# Patient Record
Sex: Male | Born: 1960
Health system: Southern US, Community
[De-identification: ages and names within clinical notes are randomized; demographics above are authoritative.]

## PROBLEM LIST (undated history)

## (undated) DIAGNOSIS — Z8619 Personal history of other infectious and parasitic diseases: Secondary | ICD-10-CM

## (undated) DIAGNOSIS — C4492 Squamous cell carcinoma of skin, unspecified: Secondary | ICD-10-CM

## (undated) DIAGNOSIS — M109 Gout, unspecified: Secondary | ICD-10-CM

## (undated) DIAGNOSIS — C4491 Basal cell carcinoma of skin, unspecified: Secondary | ICD-10-CM

## (undated) DIAGNOSIS — I10 Essential (primary) hypertension: Secondary | ICD-10-CM

## (undated) DIAGNOSIS — C439 Malignant melanoma of skin, unspecified: Secondary | ICD-10-CM

## (undated) HISTORY — DX: Gout, unspecified: M10.9

## (undated) HISTORY — DX: Essential (primary) hypertension: I10

## (undated) HISTORY — DX: Personal history of other infectious and parasitic diseases: Z86.19

---
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
## 1996-12-14 HISTORY — PX: CHOLECYSTECTOMY: SHX55

## 2010-12-14 HISTORY — PX: COLONOSCOPY: SHX174

## 2011-03-27 LAB — HM COLONOSCOPY: HM COLON: NORMAL

## 2013-08-07 DIAGNOSIS — C4492 Squamous cell carcinoma of skin, unspecified: Secondary | ICD-10-CM

## 2013-08-07 HISTORY — DX: Squamous cell carcinoma of skin, unspecified: C44.92

## 2014-03-23 ENCOUNTER — Telehealth: Payer: Self-pay

## 2014-03-23 NOTE — Telephone Encounter (Signed)
Unable to reach patient.  Invalid number.

## 2014-03-26 ENCOUNTER — Encounter: Payer: Self-pay | Admitting: Family Medicine

## 2014-03-26 ENCOUNTER — Ambulatory Visit (INDEPENDENT_AMBULATORY_CARE_PROVIDER_SITE_OTHER): Payer: BC Managed Care – PPO | Admitting: Family Medicine

## 2014-03-26 ENCOUNTER — Other Ambulatory Visit: Payer: Self-pay | Admitting: Family Medicine

## 2014-03-26 VITALS — BP 140/100 | HR 91 | Temp 98.8°F | Resp 16 | Ht 69.75 in | Wt 257.5 lb

## 2014-03-26 DIAGNOSIS — I10 Essential (primary) hypertension: Secondary | ICD-10-CM

## 2014-03-26 DIAGNOSIS — M109 Gout, unspecified: Secondary | ICD-10-CM

## 2014-03-26 MED ORDER — TELMISARTAN 80 MG PO TABS: 80.0000 mg | ORAL_TABLET | Freq: Every day | ORAL | Status: DC

## 2014-03-26 MED ORDER — ALLOPURINOL 300 MG PO TABS: 300.0000 mg | ORAL_TABLET | Freq: Every day | ORAL | Status: DC

## 2014-03-26 MED ORDER — CARVEDILOL 25 MG PO TABS: 25.0000 mg | ORAL_TABLET | Freq: Two times a day (BID) | ORAL | Status: DC

## 2014-03-26 NOTE — Telephone Encounter (Signed)
Unable to reach patient pre visit.  

## 2014-03-26 NOTE — Progress Notes (Signed)
Pre visit review using our clinic review tool, if applicable. No additional management support is needed unless otherwise documented below in the visit note. 

## 2014-03-26 NOTE — Assessment & Plan Note (Signed)
New to provider, ongoing for pt.  Not well controlled.  Current meds are odd and not maximized.  Pt on 5 meds but not taking max dose ARB or Coreg BID.  Will try to maximize meds, stop diuretic if possible as this is likely contributing to pt's gout.  Wean Hydralazine due to inconvenient dosing.  Will try and maximize and simplify pt's med regimen.  Pt is all for this.  Will follow closely.

## 2014-03-26 NOTE — Progress Notes (Signed)
   Subjective:    Patient ID: Keith Haynes, male    DOB: May 21, 1961, 53 y.o.   MRN: [id number]  HPI New to establish.  Previous MD- none recently [facility] about 5-6 yrs ago) Cards- Dr Bettina Gavia Tia Alert)  HTN- chronic problem, has had hx of difficult to control BP.  Had renal US, cath, stress test w/o any obvious cause.  No CP, SOB, HAs, visual changes, edema.  On Carvedilol, Eplerenone, Hydralazine, Lasix (1/2 tab), Micardis (1/2 tab).  Gout- chronic problem, on Allopurinol and Colcrys (it's useless since switching from Colchicine)  Continues to have flares.  Review of Systems For ROS see HPI     Objective:   Physical Exam  Vitals reviewed. Constitutional: He is oriented to person, place, and time. He appears well-developed and well-nourished. No distress.  HENT:  Head: Normocephalic and atraumatic.  Eyes: Conjunctivae and EOM are normal. Pupils are equal, round, and reactive to light.  Neck: Normal range of motion. Neck supple. No thyromegaly present.  Cardiovascular: Normal rate, regular rhythm, normal heart sounds and intact distal pulses.   No murmur heard. Pulmonary/Chest: Effort normal and breath sounds normal. No respiratory distress.  Abdominal: Soft. Bowel sounds are normal. He exhibits no distension.  Musculoskeletal: He exhibits no edema.  Lymphadenopathy:    He has no cervical adenopathy.  Neurological: He is alert and oriented to person, place, and time. No cranial nerve deficit.  Skin: Skin is warm and dry.  Psychiatric: He has a normal mood and affect. His behavior is normal.          Assessment & Plan:

## 2014-03-26 NOTE — Patient Instructions (Signed)
Follow up in 1 month to recheck BP Please check your BP at home daily if you are able and record the readings.  If they go up as we change the meds, please call me! Start taking Carvedilol twice daily Increase the Micardis to 1 tab daily (not 1/2 tab) Decrease the Hydralazine to twice daily x1 week, then once daily x1 week and then stop STOP the eplerenone (your 2nd fluid pill) Continue the Furosemide 1/2 tab daily (fluid pill) Start the new allopurinol dose- 300mg  daily We'll notify you of your lab results and make any changes if needed Call with any questions or concerns Welcome!  We're glad to have you!!

## 2014-03-26 NOTE — Assessment & Plan Note (Signed)
New to provider, ongoing for pt.  Increase allopurinol to 300mg  daily to try and get better control of UA and prevent flares.  Continue Colchicine while titrating allopurinol to prevent flare.  Will wean as able.  Pt expressed understanding and is in agreement w/ plan.

## 2014-03-27 ENCOUNTER — Telehealth: Payer: Self-pay | Admitting: Family Medicine

## 2014-03-27 LAB — BASIC METABOLIC PANEL
BUN: 22 mg/dL (ref 6–23)
CHLORIDE: 106 meq/L (ref 96–112)
CO2: 26 meq/L (ref 19–32)
Calcium: 9.1 mg/dL (ref 8.4–10.5)
Creatinine, Ser: 1.3 mg/dL (ref 0.4–1.5)
GFR: 62.37 mL/min (ref >60.00)
Glucose, Bld: 121 mg/dL — ABNORMAL HIGH (ref 70–99)
POTASSIUM: 3.7 meq/L (ref 3.5–5.1)
Sodium: 140 mEq/L (ref 135–145)

## 2014-03-27 LAB — HEPATIC FUNCTION PANEL
ALK PHOS: 143 U/L — AB (ref 39–117)
ALT: 19 U/L (ref 0–53)
AST: 25 U/L (ref 0–37)
Albumin: 3.7 g/dL (ref 3.5–5.2)
BILIRUBIN DIRECT: 0 mg/dL (ref 0.0–0.3)
BILIRUBIN TOTAL: 0.4 mg/dL (ref 0.3–1.2)
Total Protein: 6.7 g/dL (ref 6.0–8.3)

## 2014-03-27 LAB — TSH: TSH: 1.33 u[IU]/mL (ref 0.35–5.50)

## 2014-03-27 LAB — LIPID PANEL
CHOLESTEROL: 180 mg/dL (ref 0–200)
HDL: 44.9 mg/dL (ref >39.00)
LDL CALC: 103 mg/dL — AB (ref 0–99)
TRIGLYCERIDES: 161 mg/dL — AB (ref 0.0–149.0)
Total CHOL/HDL Ratio: 4
VLDL: 32.2 mg/dL (ref 0.0–40.0)

## 2014-03-27 LAB — URIC ACID: URIC ACID, SERUM: 7.3 mg/dL (ref 4.0–7.8)

## 2014-03-27 NOTE — Telephone Encounter (Signed)
Relevant patient education assigned to patient using Emmi. ° °

## 2014-03-28 ENCOUNTER — Ambulatory Visit: Payer: BC Managed Care – PPO

## 2014-03-28 DIAGNOSIS — R748 Abnormal levels of other serum enzymes: Secondary | ICD-10-CM

## 2014-03-28 LAB — GAMMA GT: GGT: 32 U/L (ref 7–51)

## 2014-04-05 ENCOUNTER — Encounter: Payer: Self-pay | Admitting: Family Medicine

## 2014-04-05 MED ORDER — TELMISARTAN-HCTZ 80-12.5 MG PO TABS: 1.0000 | ORAL_TABLET | Freq: Every day | ORAL | Status: DC

## 2014-04-05 MED ORDER — AMLODIPINE BESYLATE 5 MG PO TABS: 5.0000 mg | ORAL_TABLET | Freq: Every day | ORAL | Status: DC

## 2014-04-05 NOTE — Telephone Encounter (Signed)
Amlodipine added per Tabori.

## 2014-04-05 NOTE — Addendum Note (Signed)
Addended by: Kris Hartmann on: 04/05/2014 01:57 PM   Modules accepted: Orders

## 2014-04-05 NOTE — Telephone Encounter (Signed)
Pt changed to micardis/HCT 80-12.5 per [employer].

## 2014-04-17 DIAGNOSIS — C439 Malignant melanoma of skin, unspecified: Secondary | ICD-10-CM | POA: Insufficient documentation

## 2014-04-17 HISTORY — DX: Malignant melanoma of skin, unspecified: C43.9

## 2014-04-25 ENCOUNTER — Ambulatory Visit: Payer: BC Managed Care – PPO | Admitting: Family Medicine

## 2014-04-26 ENCOUNTER — Ambulatory Visit (INDEPENDENT_AMBULATORY_CARE_PROVIDER_SITE_OTHER): Payer: BC Managed Care – PPO | Admitting: Family Medicine

## 2014-04-26 ENCOUNTER — Encounter: Payer: Self-pay | Admitting: Family Medicine

## 2014-04-26 VITALS — BP 138/100 | HR 77 | Temp 98.2°F | Resp 14 | Wt 256.8 lb

## 2014-04-26 DIAGNOSIS — I10 Essential (primary) hypertension: Secondary | ICD-10-CM

## 2014-04-26 DIAGNOSIS — N529 Male erectile dysfunction, unspecified: Secondary | ICD-10-CM | POA: Insufficient documentation

## 2014-04-26 LAB — BASIC METABOLIC PANEL
BUN: 23 mg/dL (ref 6–23)
CO2: 29 meq/L (ref 19–32)
Calcium: 9.3 mg/dL (ref 8.4–10.5)
Chloride: 102 mEq/L (ref 96–112)
Creatinine, Ser: 1.4 mg/dL (ref 0.4–1.5)
GFR: 57.17 mL/min — ABNORMAL LOW (ref >60.00)
GLUCOSE: 90 mg/dL (ref 70–99)
POTASSIUM: 3.5 meq/L (ref 3.5–5.1)
Sodium: 138 mEq/L (ref 135–145)

## 2014-04-26 MED ORDER — CARVEDILOL 25 MG PO TABS: 25.0000 mg | ORAL_TABLET | Freq: Two times a day (BID) | ORAL | Status: DC

## 2014-04-26 MED ORDER — TADALAFIL 20 MG PO TABS: 20.0000 mg | ORAL_TABLET | Freq: Every day | ORAL | Status: DC | PRN

## 2014-04-26 MED ORDER — AMLODIPINE BESYLATE 5 MG PO TABS: 5.0000 mg | ORAL_TABLET | Freq: Every day | ORAL | Status: DC

## 2014-04-26 MED ORDER — ALLOPURINOL 300 MG PO TABS: 300.0000 mg | ORAL_TABLET | Freq: Every day | ORAL | Status: DC

## 2014-04-26 MED ORDER — FUROSEMIDE 20 MG PO TABS: 20.0000 mg | ORAL_TABLET | Freq: Every day | ORAL | Status: DC

## 2014-04-26 MED ORDER — TELMISARTAN-HCTZ 80-12.5 MG PO TABS: 1.0000 | ORAL_TABLET | Freq: Every day | ORAL | Status: DC

## 2014-04-26 NOTE — Patient Instructions (Signed)
Schedule your complete physical in 6 months We'll notify you of your lab results and make any changes if needed (but I don't expect this!) Start the Cialis as needed- break the pills in half for the 10mg  dose Call with any questions or concerns So glad that you're feeling better!

## 2014-04-26 NOTE — Assessment & Plan Note (Signed)
BP is improved since last visit.  Pt is feeling better.  Check BMP.  No anticipated changes.  meds filled.

## 2014-04-26 NOTE — Assessment & Plan Note (Signed)
New.  Pt given script and coupon for Cialis.  Reviewed appropriate use and possible side effects.  Will follow.

## 2014-04-26 NOTE — Progress Notes (Signed)
   Subjective:    Patient ID: Keith Haynes, male    DOB: 08/03/1961, 53 y.o.   MRN: [id number]  HPI HTN- chronic problem.  On Micardis HCTZ, Amlodipine, Coreg.  Has weaned off Hydralazine.  Home BP ranging 106-154/65-95.  No CP, SOB, HAs, visual changes, edema.  Pt actually reports less HAs.    ED- pt reports now that BP is better controlled, the ED is worse.  Able to have erections but not as strong as previous.  Review of Systems For ROS see HPI     Objective:   Physical Exam  Vitals reviewed. Constitutional: He is oriented to person, place, and time. He appears well-developed and well-nourished. No distress.  HENT:  Head: Normocephalic and atraumatic.  Eyes: Conjunctivae and EOM are normal. Pupils are equal, round, and reactive to light.  Neck: Normal range of motion. Neck supple. No thyromegaly present.  Cardiovascular: Normal rate, regular rhythm, normal heart sounds and intact distal pulses.   No murmur heard. Pulmonary/Chest: Effort normal and breath sounds normal. No respiratory distress.  Abdominal: Soft. Bowel sounds are normal. He exhibits no distension.  Musculoskeletal: He exhibits no edema.  Lymphadenopathy:    He has no cervical adenopathy.  Neurological: He is alert and oriented to person, place, and time. No cranial nerve deficit.  Skin: Skin is warm and dry.  Psychiatric: He has a normal mood and affect. His behavior is normal.          Assessment & Plan:

## 2014-04-27 ENCOUNTER — Encounter: Payer: Self-pay | Admitting: Family Medicine

## 2014-04-27 ENCOUNTER — Telehealth: Payer: Self-pay | Admitting: Family Medicine

## 2014-04-27 NOTE — Telephone Encounter (Signed)
Relevant patient education assigned to patient using Emmi. ° °

## 2014-05-17 ENCOUNTER — Other Ambulatory Visit: Payer: Self-pay | Admitting: Family Medicine

## 2014-05-18 NOTE — Telephone Encounter (Signed)
Med filled.  

## 2014-05-22 ENCOUNTER — Telehealth: Payer: Self-pay

## 2014-05-22 NOTE — Telephone Encounter (Signed)
Necessary prior authorization paperwork faxed to [facility]. Awaiting determination. JG//CMA

## 2014-05-22 NOTE — Telephone Encounter (Signed)
Received approval letter from [facility] effective from 05/22/2014 through 12/13/2038. Reference number: [id number]. Called and informed patient. JG//CMA

## 2014-05-22 NOTE — Telephone Encounter (Signed)
Spoke with patient regarding PA for Cialis. Advised that the PA has been sent to the insurance company. Apologized for the miscommunication with the computers. Advised to let us know if he has not heard by the end of the week.

## 2014-09-13 DIAGNOSIS — C4492 Squamous cell carcinoma of skin, unspecified: Secondary | ICD-10-CM | POA: Insufficient documentation

## 2014-10-03 DIAGNOSIS — C4491 Basal cell carcinoma of skin, unspecified: Secondary | ICD-10-CM

## 2014-10-03 HISTORY — DX: Basal cell carcinoma of skin, unspecified: C44.91

## 2014-10-29 ENCOUNTER — Encounter: Payer: Self-pay | Admitting: Family Medicine

## 2014-10-29 ENCOUNTER — Ambulatory Visit (INDEPENDENT_AMBULATORY_CARE_PROVIDER_SITE_OTHER): Payer: BC Managed Care – PPO | Admitting: Family Medicine

## 2014-10-29 VITALS — BP 130/84 | HR 72 | Temp 98.1°F | Resp 16 | Ht 69.75 in | Wt 258.1 lb

## 2014-10-29 DIAGNOSIS — M1009 Idiopathic gout, multiple sites: Secondary | ICD-10-CM

## 2014-10-29 DIAGNOSIS — Z0000 Encounter for general adult medical examination without abnormal findings: Secondary | ICD-10-CM

## 2014-10-29 LAB — BASIC METABOLIC PANEL
BUN: 25 mg/dL — AB (ref 6–23)
CALCIUM: 9.6 mg/dL (ref 8.4–10.5)
CO2: 29 meq/L (ref 19–32)
CREATININE: 1.2 mg/dL (ref 0.4–1.5)
Chloride: 104 mEq/L (ref 96–112)
GFR: 67.7 mL/min (ref >60.00)
GLUCOSE: 102 mg/dL — AB (ref 70–99)
Potassium: 4.2 mEq/L (ref 3.5–5.1)
Sodium: 141 mEq/L (ref 135–145)

## 2014-10-29 LAB — CBC WITH DIFFERENTIAL/PLATELET
Basophils Absolute: 0 10*3/uL (ref 0.0–0.1)
Basophils Relative: 0.5 % (ref 0.0–3.0)
Eosinophils Absolute: 0.5 10*3/uL (ref 0.0–0.7)
Eosinophils Relative: 6.2 % — ABNORMAL HIGH (ref 0.0–5.0)
HCT: 50 % (ref 39.0–52.0)
Hemoglobin: 16.3 g/dL (ref 13.0–17.0)
Lymphocytes Relative: 22.6 % (ref 12.0–46.0)
Lymphs Abs: 1.8 10*3/uL (ref 0.7–4.0)
MCHC: 32.6 g/dL (ref 30.0–36.0)
MCV: 101.8 fl — ABNORMAL HIGH (ref 78.0–100.0)
Monocytes Absolute: 1 10*3/uL (ref 0.1–1.0)
Monocytes Relative: 12.1 % — ABNORMAL HIGH (ref 3.0–12.0)
Neutro Abs: 4.7 10*3/uL (ref 1.4–7.7)
Neutrophils Relative %: 58.6 % (ref 43.0–77.0)
Platelets: 195 10*3/uL (ref 150.0–400.0)
RBC: 4.91 Mil/uL (ref 4.22–5.81)
RDW: 13.5 % (ref 11.5–15.5)
WBC: 8.1 10*3/uL (ref 4.0–10.5)

## 2014-10-29 LAB — TSH: TSH: 2.15 u[IU]/mL (ref 0.35–4.50)

## 2014-10-29 LAB — LIPID PANEL
Cholesterol: 197 mg/dL (ref 0–200)
HDL: 40.3 mg/dL (ref >39.00)
LDL Cholesterol: 132 mg/dL — ABNORMAL HIGH (ref 0–99)
NONHDL: 156.7
TRIGLYCERIDES: 125 mg/dL (ref 0.0–149.0)
Total CHOL/HDL Ratio: 5
VLDL: 25 mg/dL (ref 0.0–40.0)

## 2014-10-29 LAB — HEPATIC FUNCTION PANEL
ALT: 29 U/L (ref 0–53)
AST: 30 U/L (ref 0–37)
Albumin: 4 g/dL (ref 3.5–5.2)
Alkaline Phosphatase: 127 U/L — ABNORMAL HIGH (ref 39–117)
Bilirubin, Direct: 0.1 mg/dL (ref 0.0–0.3)
Total Bilirubin: 0.6 mg/dL (ref 0.2–1.2)
Total Protein: 6.7 g/dL (ref 6.0–8.3)

## 2014-10-29 LAB — PSA: PSA: 1.4 ng/mL (ref 0.10–4.00)

## 2014-10-29 LAB — URIC ACID: Uric Acid, Serum: 9.6 mg/dL — ABNORMAL HIGH (ref 4.0–7.8)

## 2014-10-29 MED ORDER — AMLODIPINE BESYLATE 5 MG PO TABS: 5.0000 mg | ORAL_TABLET | Freq: Every day | ORAL | Status: DC

## 2014-10-29 MED ORDER — FUROSEMIDE 20 MG PO TABS: 20.0000 mg | ORAL_TABLET | Freq: Every day | ORAL | Status: DC

## 2014-10-29 MED ORDER — TADALAFIL 20 MG PO TABS: 20.0000 mg | ORAL_TABLET | Freq: Every day | ORAL | Status: DC | PRN

## 2014-10-29 MED ORDER — TELMISARTAN-HCTZ 80-12.5 MG PO TABS: 1.0000 | ORAL_TABLET | Freq: Every day | ORAL | Status: DC

## 2014-10-29 MED ORDER — COLCHICINE 0.6 MG PO TABS: ORAL_TABLET | ORAL | Status: DC

## 2014-10-29 MED ORDER — CARVEDILOL 25 MG PO TABS: 25.0000 mg | ORAL_TABLET | Freq: Two times a day (BID) | ORAL | Status: DC

## 2014-10-29 MED ORDER — ALLOPURINOL 300 MG PO TABS: 300.0000 mg | ORAL_TABLET | Freq: Every day | ORAL | Status: DC

## 2014-10-29 NOTE — Patient Instructions (Signed)
Follow up in 6 months to recheck BP We'll notify you of your lab results and make any changes Try and make healthy food choices and get regular exercise Call with any questions or concerns Happy Holidays!!!

## 2014-10-29 NOTE — Progress Notes (Signed)
Pre visit review using our clinic review tool, if applicable. No additional management support is needed unless otherwise documented below in the visit note. 

## 2014-10-29 NOTE — Progress Notes (Signed)
   Subjective:    Patient ID: Keith Haynes, male    DOB: 23-May-1961, 53 y.o.   MRN: [id number]  HPI CPE- UTD on colonoscopy.     Review of Systems Patient reports no vision/hearing changes, anorexia, fever ,adenopathy, persistant/recurrent hoarseness, swallowing issues, chest pain, palpitations, edema, persistant/recurrent cough, hemoptysis, dyspnea (rest,exertional, paroxysmal nocturnal), gastrointestinal  bleeding (melena, rectal bleeding), abdominal pain, excessive heart burn, GU symptoms (dysuria, hematuria, voiding/incontinence issues) syncope, focal weakness, memory loss, numbness & tingling, skin/hair/nail changes, depression, anxiety, abnormal bruising/bleeding, musculoskeletal symptoms/signs.     Objective:   Physical Exam BP 130/84 mmHg  Pulse 72  Temp(Src) 98.1 F (36.7 C) (Oral)  Resp 16  Ht 5' 9.75" (1.772 m)  Wt 258 lb 2 oz (117.085 kg)  BMI 37.29 kg/m2  SpO2 95%  General Appearance:    Alert, cooperative, no distress, appears stated age  Head:    Normocephalic, without obvious abnormality, atraumatic  Eyes:    PERRL, conjunctiva/corneas clear, EOM's intact, fundi    benign, both eyes       Ears:    Normal TM's and external ear canals, both ears  Nose:   Nares normal, septum midline, mucosa normal, no drainage   or sinus tenderness  Throat:   Lips, mucosa, and tongue normal; teeth and gums normal  Neck:   Supple, symmetrical, trachea midline, no adenopathy;       thyroid:  No enlargement/tenderness/nodules  Back:     Symmetric, no curvature, ROM normal, no CVA tenderness  Lungs:     Clear to auscultation bilaterally, respirations unlabored  Chest wall:    No tenderness or deformity  Heart:    Regular rate and rhythm, S1 and S2 normal, no murmur, rub   or gallop  Abdomen:     Soft, non-tender, bowel sounds active all four quadrants,    no masses, no organomegaly  Genitalia:    Normal male without lesion, discharge or tenderness  Rectal:    Normal tone, normal  prostate, no masses or tenderness  Extremities:   Extremities normal, atraumatic, no cyanosis or edema  Pulses:   2+ and symmetric all extremities  Skin:   Skin color, texture, turgor normal, no rashes or lesions  Lymph nodes:   Cervical, supraclavicular, and axillary nodes normal  Neurologic:   CNII-XII intact. Normal strength, sensation and reflexes      throughout          Assessment & Plan:

## 2014-10-29 NOTE — Assessment & Plan Note (Signed)
Pt's PE WNL w/ exception of obesity.  UTD on colonoscopy.  Check labs.  Anticipatory guidance provided.  

## 2014-10-29 NOTE — Assessment & Plan Note (Signed)
Pt reports that increasing med dosage has improved sxs.  Check labs to asses uric acid.

## 2015-04-30 ENCOUNTER — Ambulatory Visit (INDEPENDENT_AMBULATORY_CARE_PROVIDER_SITE_OTHER): Payer: BLUE CROSS/BLUE SHIELD | Admitting: Family Medicine

## 2015-04-30 ENCOUNTER — Encounter: Payer: Self-pay | Admitting: Family Medicine

## 2015-04-30 VITALS — BP 130/78 | HR 77 | Temp 98.0°F | Resp 16 | Wt 257.5 lb

## 2015-04-30 DIAGNOSIS — I10 Essential (primary) hypertension: Secondary | ICD-10-CM

## 2015-04-30 DIAGNOSIS — N529 Male erectile dysfunction, unspecified: Secondary | ICD-10-CM | POA: Diagnosis not present

## 2015-04-30 LAB — CBC WITH DIFFERENTIAL/PLATELET
BASOS ABS: 0 10*3/uL (ref 0.0–0.1)
Basophils Relative: 0.5 % (ref 0.0–3.0)
Eosinophils Absolute: 0.5 10*3/uL (ref 0.0–0.7)
Eosinophils Relative: 7.7 % — ABNORMAL HIGH (ref 0.0–5.0)
HCT: 46.1 % (ref 39.0–52.0)
Hemoglobin: 15.9 g/dL (ref 13.0–17.0)
LYMPHS PCT: 23.8 % (ref 12.0–46.0)
Lymphs Abs: 1.7 10*3/uL (ref 0.7–4.0)
MCHC: 34.5 g/dL (ref 30.0–36.0)
MCV: 97.9 fl (ref 78.0–100.0)
MONOS PCT: 10.9 % (ref 3.0–12.0)
Monocytes Absolute: 0.8 10*3/uL (ref 0.1–1.0)
NEUTROS PCT: 57.1 % (ref 43.0–77.0)
Neutro Abs: 4.1 10*3/uL (ref 1.4–7.7)
Platelets: 168 10*3/uL (ref 150.0–400.0)
RBC: 4.71 Mil/uL (ref 4.22–5.81)
RDW: 13.2 % (ref 11.5–15.5)
WBC: 7.1 10*3/uL (ref 4.0–10.5)

## 2015-04-30 LAB — BASIC METABOLIC PANEL
BUN: 18 mg/dL (ref 6–23)
CALCIUM: 9.6 mg/dL (ref 8.4–10.5)
CO2: 30 mEq/L (ref 19–32)
Chloride: 105 mEq/L (ref 96–112)
Creatinine, Ser: 1.19 mg/dL (ref 0.40–1.50)
GFR: 67.57 mL/min (ref >60.00)
GLUCOSE: 111 mg/dL — AB (ref 70–99)
Potassium: 3.9 mEq/L (ref 3.5–5.1)
SODIUM: 139 meq/L (ref 135–145)

## 2015-04-30 MED ORDER — TELMISARTAN-HCTZ 80-12.5 MG PO TABS: 1.0000 | ORAL_TABLET | Freq: Every day | ORAL | Status: DC

## 2015-04-30 MED ORDER — FUROSEMIDE 20 MG PO TABS: 20.0000 mg | ORAL_TABLET | Freq: Every day | ORAL | Status: DC

## 2015-04-30 MED ORDER — SILDENAFIL CITRATE 100 MG PO TABS: 50.0000 mg | ORAL_TABLET | Freq: Every day | ORAL | Status: DC | PRN

## 2015-04-30 MED ORDER — AMLODIPINE BESYLATE 5 MG PO TABS: 5.0000 mg | ORAL_TABLET | Freq: Every day | ORAL | Status: DC

## 2015-04-30 MED ORDER — CARVEDILOL 25 MG PO TABS: 25.0000 mg | ORAL_TABLET | Freq: Two times a day (BID) | ORAL | Status: DC

## 2015-04-30 MED ORDER — COLCHICINE 0.6 MG PO TABS: ORAL_TABLET | ORAL | Status: DC

## 2015-04-30 NOTE — Progress Notes (Signed)
   Subjective:    Patient ID: Keith Haynes, male    DOB: 1961-06-18, 54 y.o.   MRN: [id number]  HPI HTN- chronic problem for pt.  On Micardis HCT, Lasix, Coreg, Amlodipine.  Adequate control.  No CP, SOB, HAs, visual changes, edema.  ED- pt reports medication works but for 2-3 days afterwards will have leg and hip soreness.  Pt denies soreness from sex, 'it's a weird ache that just kinda wears off'.  Taking 1/2 pill.  Pt wants to switch to Viagra and see if he still has sxs.   Review of Systems For ROS see HPI     Objective:   Physical Exam  Constitutional: He is oriented to person, place, and time. He appears well-developed and well-nourished. No distress.  HENT:  Head: Normocephalic and atraumatic.  Eyes: Conjunctivae and EOM are normal. Pupils are equal, round, and reactive to light.  Neck: Normal range of motion. Neck supple. No thyromegaly present.  Cardiovascular: Normal rate, regular rhythm, normal heart sounds and intact distal pulses.   No murmur heard. Pulmonary/Chest: Effort normal and breath sounds normal. No respiratory distress.  Abdominal: Soft. Bowel sounds are normal. He exhibits no distension.  Musculoskeletal: He exhibits no edema.  Lymphadenopathy:    He has no cervical adenopathy.  Neurological: He is alert and oriented to person, place, and time. No cranial nerve deficit.  Skin: Skin is warm and dry.  Psychiatric: He has a normal mood and affect. His behavior is normal.  Vitals reviewed.         Assessment & Plan:

## 2015-04-30 NOTE — Patient Instructions (Signed)
Schedule your complete physical in 6 months We'll notify you of your lab results and make any changes if needed Keep up the good work on healthy diet and regular exercise Try the Viagra and let me know if you have the same leg pains Call with any questions or concerns Have a great time at the beach!

## 2015-04-30 NOTE — Assessment & Plan Note (Signed)
Chronic problem.  Adequate control.  Asymptomatic today.  Check labs.  No anticipated med changes.

## 2015-04-30 NOTE — Progress Notes (Signed)
Pre visit review using our clinic review tool, if applicable. No additional management support is needed unless otherwise documented below in the visit note. 

## 2015-04-30 NOTE — Assessment & Plan Note (Signed)
Ongoing issue for pt.  sxs improved w/ Cialis but he had 'weird leg aches'.  Will switch to Viagra and see if he has similar sxs.  Will follow.

## 2015-05-02 ENCOUNTER — Other Ambulatory Visit (INDEPENDENT_AMBULATORY_CARE_PROVIDER_SITE_OTHER): Payer: BLUE CROSS/BLUE SHIELD

## 2015-05-02 DIAGNOSIS — R7309 Other abnormal glucose: Secondary | ICD-10-CM

## 2015-05-02 DIAGNOSIS — R7303 Prediabetes: Secondary | ICD-10-CM

## 2015-05-02 LAB — HEMOGLOBIN A1C: HEMOGLOBIN A1C: 5.5 % (ref 4.6–6.5)

## 2015-11-02 ENCOUNTER — Other Ambulatory Visit: Payer: Self-pay | Admitting: Family Medicine

## 2015-11-04 ENCOUNTER — Telehealth: Payer: Self-pay | Admitting: Behavioral Health

## 2015-11-04 ENCOUNTER — Encounter: Payer: Self-pay | Admitting: Behavioral Health

## 2015-11-04 NOTE — Telephone Encounter (Signed)
Medication filled to pharmacy as requested.   

## 2015-11-04 NOTE — Telephone Encounter (Signed)
Pre-Visit Call completed with patient and chart updated.   Pre-Visit Info documented in Specialty Comments under [employer].    

## 2015-11-05 ENCOUNTER — Ambulatory Visit (INDEPENDENT_AMBULATORY_CARE_PROVIDER_SITE_OTHER): Payer: BLUE CROSS/BLUE SHIELD | Admitting: Family Medicine

## 2015-11-05 ENCOUNTER — Encounter: Payer: Self-pay | Admitting: Family Medicine

## 2015-11-05 VITALS — BP 138/78 | HR 66 | Temp 98.1°F | Resp 16 | Ht 70.0 in | Wt 258.2 lb

## 2015-11-05 DIAGNOSIS — M1A09X0 Idiopathic chronic gout, multiple sites, without tophus (tophi): Secondary | ICD-10-CM

## 2015-11-05 DIAGNOSIS — Z0000 Encounter for general adult medical examination without abnormal findings: Secondary | ICD-10-CM | POA: Diagnosis not present

## 2015-11-05 LAB — CBC WITH DIFFERENTIAL/PLATELET
Basophils Absolute: 0 10*3/uL (ref 0.0–0.1)
Basophils Relative: 0.6 % (ref 0.0–3.0)
EOS PCT: 6.1 % — AB (ref 0.0–5.0)
Eosinophils Absolute: 0.5 10*3/uL (ref 0.0–0.7)
HCT: 50.3 % (ref 39.0–52.0)
HEMOGLOBIN: 17.1 g/dL — AB (ref 13.0–17.0)
Lymphocytes Relative: 23.8 % (ref 12.0–46.0)
Lymphs Abs: 1.8 10*3/uL (ref 0.7–4.0)
MCHC: 34.1 g/dL (ref 30.0–36.0)
MCV: 98.8 fl (ref 78.0–100.0)
MONO ABS: 0.8 10*3/uL (ref 0.1–1.0)
Monocytes Relative: 11.1 % (ref 3.0–12.0)
Neutro Abs: 4.3 10*3/uL (ref 1.4–7.7)
Neutrophils Relative %: 58.4 % (ref 43.0–77.0)
Platelets: 175 10*3/uL (ref 150.0–400.0)
RBC: 5.09 Mil/uL (ref 4.22–5.81)
RDW: 13.1 % (ref 11.5–15.5)
WBC: 7.4 10*3/uL (ref 4.0–10.5)

## 2015-11-05 LAB — BASIC METABOLIC PANEL
BUN: 19 mg/dL (ref 6–23)
CHLORIDE: 104 meq/L (ref 96–112)
CO2: 25 mEq/L (ref 19–32)
CREATININE: 1.07 mg/dL (ref 0.40–1.50)
Calcium: 9.3 mg/dL (ref 8.4–10.5)
GFR: 76.24 mL/min (ref >60.00)
Glucose, Bld: 103 mg/dL — ABNORMAL HIGH (ref 70–99)
Potassium: 3.9 mEq/L (ref 3.5–5.1)
Sodium: 139 mEq/L (ref 135–145)

## 2015-11-05 LAB — LIPID PANEL
Cholesterol: 193 mg/dL (ref 0–200)
HDL: 51 mg/dL (ref >39.00)
LDL Cholesterol: 124 mg/dL — ABNORMAL HIGH (ref 0–99)
NonHDL: 142.38
TRIGLYCERIDES: 93 mg/dL (ref 0.0–149.0)
Total CHOL/HDL Ratio: 4
VLDL: 18.6 mg/dL (ref 0.0–40.0)

## 2015-11-05 LAB — HEPATIC FUNCTION PANEL
ALBUMIN: 4.2 g/dL (ref 3.5–5.2)
ALT: 24 U/L (ref 0–53)
AST: 35 U/L (ref 0–37)
Alkaline Phosphatase: 114 U/L (ref 39–117)
Bilirubin, Direct: 0.1 mg/dL (ref 0.0–0.3)
Total Bilirubin: 0.8 mg/dL (ref 0.2–1.2)
Total Protein: 6.8 g/dL (ref 6.0–8.3)

## 2015-11-05 LAB — VITAMIN D 25 HYDROXY (VIT D DEFICIENCY, FRACTURES): VITD: 20.98 ng/mL — AB (ref 30.00–100.00)

## 2015-11-05 LAB — TSH: TSH: 1.85 u[IU]/mL (ref 0.35–4.50)

## 2015-11-05 LAB — PSA: PSA: 1.2 ng/mL (ref 0.10–4.00)

## 2015-11-05 LAB — URIC ACID: URIC ACID, SERUM: 7.6 mg/dL (ref 4.0–7.8)

## 2015-11-05 NOTE — Patient Instructions (Signed)
Follow up in 6 months to recheck BP and cholesterol We'll notify you of your lab results and make any changes if needed Continue to work on healthy diet and regular exercise Call with any questions or concerns If you want to join [place] at the new [place] office, any scheduled appointments will automatically transfer and we will see you at [address] [place] [address] [place], [place] [address] ([place] 12/17/15) Happy Holidays!!!

## 2015-11-05 NOTE — Assessment & Plan Note (Signed)
Chronic problem for pt.  Repeat UA today and adjust meds prn.

## 2015-11-05 NOTE — Progress Notes (Signed)
   Subjective:    Patient ID: Keith Haynes, male    DOB: Sep 08, 1961, 54 y.o.   MRN: [id number]  HPI CPE- UTD on colonoscopy.   Review of Systems Patient reports no vision/hearing changes, anorexia, fever ,adenopathy, persistant/recurrent hoarseness, swallowing issues, chest pain, palpitations, edema, persistant/recurrent cough, hemoptysis, dyspnea (rest,exertional, paroxysmal nocturnal), gastrointestinal  bleeding (melena, rectal bleeding), abdominal pain, excessive heart burn, GU symptoms (dysuria, hematuria, voiding/incontinence issues) syncope, focal weakness, memory loss, numbness & tingling, skin/hair/nail changes, depression, anxiety, abnormal bruising/bleeding, musculoskeletal symptoms/signs.     Objective:   Physical Exam General Appearance:    Alert, cooperative, no distress, appears stated age  Head:    Normocephalic, without obvious abnormality, atraumatic  Eyes:    PERRL, conjunctiva/corneas clear, EOM's intact, fundi    benign, both eyes       Ears:    Normal TM's and external ear canals, both ears  Nose:   Nares normal, septum midline, mucosa normal, no drainage   or sinus tenderness  Throat:   Lips, mucosa, and tongue normal; teeth and gums normal  Neck:   Supple, symmetrical, trachea midline, no adenopathy;       thyroid:  No enlargement/tenderness/nodules  Back:     Symmetric, no curvature, ROM normal, no CVA tenderness  Lungs:     Clear to auscultation bilaterally, respirations unlabored  Chest wall:    No tenderness or deformity  Heart:    Regular rate and rhythm, S1 and S2 normal, no murmur, rub   or gallop  Abdomen:     Soft, non-tender, bowel sounds active all four quadrants,    no masses, no organomegaly  Genitalia:    Deferred at pt's request  Rectal:    Extremities:   Extremities normal, atraumatic, no cyanosis or edema  Pulses:   2+ and symmetric all extremities  Skin:   Skin color, texture, turgor normal, no rashes or lesions  Lymph nodes:   Cervical,  supraclavicular, and axillary nodes normal  Neurologic:   CNII-XII intact. Normal strength, sensation and reflexes      throughout          Assessment & Plan:

## 2015-11-05 NOTE — Progress Notes (Signed)
Pre visit review using our clinic review tool, if applicable. No additional management support is needed unless otherwise documented below in the visit note. 

## 2015-11-05 NOTE — Assessment & Plan Note (Signed)
Pt's PE WNL w/ exception of being overweight.  Discussed need for healthy diet and regular exercise.  Declines flu shot.  Deferred GU exam today.  Get EKG as baseline.  Check labs.  Anticipatory guidance provided.

## 2015-11-06 ENCOUNTER — Other Ambulatory Visit: Payer: Self-pay | Admitting: General Practice

## 2015-11-06 MED ORDER — VITAMIN D (ERGOCALCIFEROL) 1.25 MG (50000 UNIT) PO CAPS: 50000.0000 [IU] | ORAL_CAPSULE | ORAL | Status: DC

## 2015-12-27 ENCOUNTER — Ambulatory Visit (HOSPITAL_BASED_OUTPATIENT_CLINIC_OR_DEPARTMENT_OTHER)
Admission: RE | Admit: 2015-12-27 | Discharge: 2015-12-27 | Disposition: A | Discharge status: Home / Self Care | Payer: BLUE CROSS/BLUE SHIELD | Source: Ambulatory Visit | Attending: Family Medicine | Admitting: Family Medicine

## 2015-12-27 ENCOUNTER — Encounter: Payer: Self-pay | Admitting: Family Medicine

## 2015-12-27 ENCOUNTER — Ambulatory Visit (INDEPENDENT_AMBULATORY_CARE_PROVIDER_SITE_OTHER): Payer: BLUE CROSS/BLUE SHIELD | Admitting: Family Medicine

## 2015-12-27 VITALS — BP 138/100 | HR 77 | Temp 98.0°F | Ht 70.0 in | Wt 247.6 lb

## 2015-12-27 DIAGNOSIS — M160 Bilateral primary osteoarthritis of hip: Secondary | ICD-10-CM | POA: Diagnosis not present

## 2015-12-27 DIAGNOSIS — M4806 Spinal stenosis, lumbar region: Secondary | ICD-10-CM | POA: Diagnosis not present

## 2015-12-27 DIAGNOSIS — M545 Low back pain, unspecified: Secondary | ICD-10-CM

## 2015-12-27 DIAGNOSIS — R109 Unspecified abdominal pain: Secondary | ICD-10-CM | POA: Insufficient documentation

## 2015-12-27 DIAGNOSIS — N200 Calculus of kidney: Secondary | ICD-10-CM | POA: Diagnosis not present

## 2015-12-27 DIAGNOSIS — M5136 Other intervertebral disc degeneration, lumbar region: Secondary | ICD-10-CM | POA: Insufficient documentation

## 2015-12-27 DIAGNOSIS — N400 Enlarged prostate without lower urinary tract symptoms: Secondary | ICD-10-CM | POA: Diagnosis not present

## 2015-12-27 DIAGNOSIS — I700 Atherosclerosis of aorta: Secondary | ICD-10-CM | POA: Diagnosis not present

## 2015-12-27 LAB — POCT URINALYSIS DIPSTICK
BILIRUBIN UA: NEGATIVE
Blood, UA: NEGATIVE
GLUCOSE UA: NEGATIVE
Ketones, UA: NEGATIVE
Leukocytes, UA: NEGATIVE
NITRITE UA: NEGATIVE
Protein, UA: NEGATIVE
UROBILINOGEN UA: 4
pH, UA: 6

## 2015-12-27 MED ORDER — MELOXICAM 15 MG PO TABS: 15.0000 mg | ORAL_TABLET | Freq: Every day | ORAL | Status: DC

## 2015-12-27 NOTE — Progress Notes (Signed)
Pre visit review using our clinic review tool, if applicable. No additional management support is needed unless otherwise documented below in the visit note. 

## 2015-12-27 NOTE — Progress Notes (Signed)
   Subjective:    Patient ID: Keith Haynes, male    DOB: 1961-03-06, 55 y.o.   MRN: [id number]  HPI Back pain- R flank pain.  sxs started in November as a mild ache first thing in the AM.  Pt notes increased stiffness in the AM since sxs first started- now painful to raise arm in the shower.  Pain now radiating up and down back and travels across.  'dull pain, an ache'.  No blood in urine.  UA WNL today.  No improvement after getting new mattress. No urinary sxs, N/V, change in bowel habits.  Pain improves as day goes on but returns each morning.  No weight loss or anorexia.  No SOB.   Review of Systems For ROS see HPI     Objective:   Physical Exam  Constitutional: He is oriented to person, place, and time. He appears well-developed and well-nourished. No distress.  HENT:  Head: Normocephalic and atraumatic.  Abdominal: Soft. He exhibits no distension. There is no tenderness. There is no rebound and no guarding.  Musculoskeletal: He exhibits tenderness (R CVA tenderness, no TTP over spine).  Neurological: He is alert and oriented to person, place, and time.  Skin: Skin is warm and dry. No rash noted. No erythema.  Psychiatric: He has a normal mood and affect. His behavior is normal. Thought content normal.  Vitals reviewed.         Assessment & Plan:

## 2015-12-27 NOTE — Assessment & Plan Note (Addendum)
New.  Pt sxs are not consistent w/ back pain.  Unlikely to be muscular due to duration of sxs and fact that they improve w/ activity.  Unclear if he has a stone that is settling overnight and then jarring each morning.  No hematuria, no urinary sxs.  Get CT to assess due to duration of pain and lack of associated sxs.  Start Mobic for sx relief.  Pt expressed understanding and is in agreement w/ plan.

## 2015-12-27 NOTE — Patient Instructions (Signed)
Follow up as needed We'll call you with your CT scan Start the Mobic once daily for pain Drink plenty of water Call with any questions or concerns Hang in there!!!

## 2016-01-15 ENCOUNTER — Encounter: Payer: Self-pay | Admitting: Family Medicine

## 2016-01-16 DIAGNOSIS — Z85828 Personal history of other malignant neoplasm of skin: Secondary | ICD-10-CM | POA: Insufficient documentation

## 2016-01-16 DIAGNOSIS — L570 Actinic keratosis: Secondary | ICD-10-CM | POA: Insufficient documentation

## 2016-01-16 DIAGNOSIS — Z85820 Personal history of malignant melanoma of skin: Secondary | ICD-10-CM | POA: Insufficient documentation

## 2016-04-01 DIAGNOSIS — L570 Actinic keratosis: Secondary | ICD-10-CM | POA: Diagnosis not present

## 2016-04-01 DIAGNOSIS — Z08 Encounter for follow-up examination after completed treatment for malignant neoplasm: Secondary | ICD-10-CM | POA: Diagnosis not present

## 2016-04-01 DIAGNOSIS — Z85820 Personal history of malignant melanoma of skin: Secondary | ICD-10-CM | POA: Diagnosis not present

## 2016-04-10 ENCOUNTER — Other Ambulatory Visit: Payer: Self-pay | Admitting: General Practice

## 2016-04-10 MED ORDER — AMLODIPINE BESYLATE 5 MG PO TABS: ORAL_TABLET | ORAL | Status: DC

## 2016-04-10 MED ORDER — TELMISARTAN-HCTZ 80-12.5 MG PO TABS: 1.0000 | ORAL_TABLET | Freq: Every day | ORAL | Status: DC

## 2016-04-29 DIAGNOSIS — D485 Neoplasm of uncertain behavior of skin: Secondary | ICD-10-CM | POA: Diagnosis not present

## 2016-04-29 DIAGNOSIS — D1801 Hemangioma of skin and subcutaneous tissue: Secondary | ICD-10-CM | POA: Diagnosis not present

## 2016-04-29 DIAGNOSIS — L570 Actinic keratosis: Secondary | ICD-10-CM | POA: Diagnosis not present

## 2016-04-29 DIAGNOSIS — C44320 Squamous cell carcinoma of skin of unspecified parts of face: Secondary | ICD-10-CM | POA: Diagnosis not present

## 2016-05-12 ENCOUNTER — Encounter: Payer: Self-pay | Admitting: Family Medicine

## 2016-05-12 ENCOUNTER — Other Ambulatory Visit: Payer: Self-pay | Admitting: General Practice

## 2016-05-12 ENCOUNTER — Ambulatory Visit (INDEPENDENT_AMBULATORY_CARE_PROVIDER_SITE_OTHER): Payer: BLUE CROSS/BLUE SHIELD | Admitting: Family Medicine

## 2016-05-12 VITALS — BP 125/83 | HR 78 | Temp 98.1°F | Resp 16 | Ht 70.0 in | Wt 224.5 lb

## 2016-05-12 DIAGNOSIS — I10 Essential (primary) hypertension: Secondary | ICD-10-CM

## 2016-05-12 DIAGNOSIS — E785 Hyperlipidemia, unspecified: Secondary | ICD-10-CM

## 2016-05-12 LAB — HEPATIC FUNCTION PANEL
ALK PHOS: 132 U/L — AB (ref 39–117)
ALT: 14 U/L (ref 0–53)
AST: 19 U/L (ref 0–37)
Albumin: 4.3 g/dL (ref 3.5–5.2)
BILIRUBIN DIRECT: 0.1 mg/dL (ref 0.0–0.3)
TOTAL PROTEIN: 6.5 g/dL (ref 6.0–8.3)
Total Bilirubin: 0.6 mg/dL (ref 0.2–1.2)

## 2016-05-12 LAB — CBC WITH DIFFERENTIAL/PLATELET
Basophils Absolute: 0 10*3/uL (ref 0.0–0.1)
Basophils Relative: 0.7 % (ref 0.0–3.0)
EOS PCT: 4.7 % (ref 0.0–5.0)
Eosinophils Absolute: 0.3 10*3/uL (ref 0.0–0.7)
HCT: 46.6 % (ref 39.0–52.0)
Hemoglobin: 15.8 g/dL (ref 13.0–17.0)
LYMPHS ABS: 1.7 10*3/uL (ref 0.7–4.0)
Lymphocytes Relative: 24.1 % (ref 12.0–46.0)
MCHC: 34 g/dL (ref 30.0–36.0)
MCV: 98.1 fl (ref 78.0–100.0)
MONO ABS: 0.7 10*3/uL (ref 0.1–1.0)
MONOS PCT: 10.2 % (ref 3.0–12.0)
NEUTROS PCT: 60.3 % (ref 43.0–77.0)
Neutro Abs: 4.3 10*3/uL (ref 1.4–7.7)
PLATELETS: 151 10*3/uL (ref 150.0–400.0)
RBC: 4.75 Mil/uL (ref 4.22–5.81)
RDW: 13.4 % (ref 11.5–15.5)
WBC: 7.1 10*3/uL (ref 4.0–10.5)

## 2016-05-12 LAB — BASIC METABOLIC PANEL
BUN: 26 mg/dL — ABNORMAL HIGH (ref 6–23)
CHLORIDE: 104 meq/L (ref 96–112)
CO2: 30 meq/L (ref 19–32)
Calcium: 9.5 mg/dL (ref 8.4–10.5)
Creatinine, Ser: 1.21 mg/dL (ref 0.40–1.50)
GFR: 66.03 mL/min (ref >60.00)
GLUCOSE: 111 mg/dL — AB (ref 70–99)
POTASSIUM: 3.8 meq/L (ref 3.5–5.1)
SODIUM: 141 meq/L (ref 135–145)

## 2016-05-12 LAB — LIPID PANEL
CHOLESTEROL: 188 mg/dL (ref 0–200)
HDL: 57 mg/dL (ref >39.00)
LDL Cholesterol: 115 mg/dL — ABNORMAL HIGH (ref 0–99)
NONHDL: 130.51
Total CHOL/HDL Ratio: 3
Triglycerides: 77 mg/dL (ref 0.0–149.0)
VLDL: 15.4 mg/dL (ref 0.0–40.0)

## 2016-05-12 MED ORDER — FUROSEMIDE 20 MG PO TABS: 20.0000 mg | ORAL_TABLET | Freq: Every day | ORAL | Status: DC

## 2016-05-12 MED ORDER — CARVEDILOL 25 MG PO TABS: ORAL_TABLET | ORAL | Status: DC

## 2016-05-12 NOTE — Assessment & Plan Note (Signed)
Chronic problem.  Excellent control.  Pt is now feeling that BP occasionally drops low since losing his weight.  If needed, pt to decrease Amlodipine to 1/2 tab for low BP.  Pt expressed understanding and is in agreement w/ plan.

## 2016-05-12 NOTE — Assessment & Plan Note (Signed)
Chronic problem.  Attempting to control w/ healthy diet and regular exercise- pt has lost 24 lbs!  Check labs.  Start meds prn.

## 2016-05-12 NOTE — Progress Notes (Signed)
   Subjective:    Patient ID: Keith Haynes, male    DOB: [birth year], 55 y.o.   MRN: [id number]  HPI HTN- chronic problem, on Amlodipine, Telmisartan-HCTZ, Coreg, Lasix w/ good BP control.  Pt has lost 24 lbs since Jan.  Pt reports feeling good.  Attempting to eat better.  No CP, SOB, HAs, visual changes, edema.  Hyperlipidemia- chronic problem, attempting to control w/ healthy diet and regular exercise.  Pt has lost 24 lbs!  No abd pain, N/V, myalgias.   Review of Systems For ROS see HPI     Objective:   Physical Exam  Constitutional: He is oriented to person, place, and time. He appears well-developed and well-nourished. No distress.  HENT:  Head: Normocephalic and atraumatic.  Eyes: Conjunctivae and EOM are normal. Pupils are equal, round, and reactive to light.  Neck: Normal range of motion. Neck supple. No thyromegaly present.  Cardiovascular: Normal rate, regular rhythm, normal heart sounds and intact distal pulses.   No murmur heard. Pulmonary/Chest: Effort normal and breath sounds normal. No respiratory distress.  Abdominal: Soft. Bowel sounds are normal. He exhibits no distension.  Musculoskeletal: He exhibits no edema.  Lymphadenopathy:    He has no cervical adenopathy.  Neurological: He is alert and oriented to person, place, and time. No cranial nerve deficit.  Skin: Skin is warm and dry.  Psychiatric: He has a normal mood and affect. His behavior is normal.  Vitals reviewed.         Assessment & Plan:

## 2016-05-12 NOTE — Progress Notes (Signed)
Pre visit review using our clinic review tool, if applicable. No additional management support is needed unless otherwise documented below in the visit note. 

## 2016-05-12 NOTE — Patient Instructions (Signed)
Schedule your complete physical in 6 months We'll notify you of your lab results and make any changes if needed Keep up the good work on healthy diet and regular exercise- you're doing great!!! Call with any questions or concerns Thanks for sticking with [place]! Have a great vacation!!!

## 2016-05-13 ENCOUNTER — Other Ambulatory Visit (INDEPENDENT_AMBULATORY_CARE_PROVIDER_SITE_OTHER): Payer: BLUE CROSS/BLUE SHIELD

## 2016-05-13 ENCOUNTER — Other Ambulatory Visit: Payer: Self-pay | Admitting: Family Medicine

## 2016-05-13 DIAGNOSIS — R748 Abnormal levels of other serum enzymes: Secondary | ICD-10-CM

## 2016-05-13 LAB — GAMMA GT: GGT: 26 U/L (ref 7–51)

## 2016-08-05 ENCOUNTER — Other Ambulatory Visit: Payer: Self-pay | Admitting: Family Medicine

## 2016-08-05 DIAGNOSIS — I10 Essential (primary) hypertension: Secondary | ICD-10-CM

## 2016-08-05 DIAGNOSIS — N529 Male erectile dysfunction, unspecified: Secondary | ICD-10-CM

## 2016-09-14 ENCOUNTER — Other Ambulatory Visit: Payer: Self-pay | Admitting: Family Medicine

## 2016-09-14 DIAGNOSIS — I10 Essential (primary) hypertension: Secondary | ICD-10-CM

## 2016-10-02 DIAGNOSIS — Z08 Encounter for follow-up examination after completed treatment for malignant neoplasm: Secondary | ICD-10-CM | POA: Diagnosis not present

## 2016-10-02 DIAGNOSIS — L570 Actinic keratosis: Secondary | ICD-10-CM | POA: Diagnosis not present

## 2016-10-02 DIAGNOSIS — Z85820 Personal history of malignant melanoma of skin: Secondary | ICD-10-CM | POA: Diagnosis not present

## 2016-11-11 ENCOUNTER — Other Ambulatory Visit: Payer: Self-pay | Admitting: General Practice

## 2016-11-11 ENCOUNTER — Encounter: Payer: Self-pay | Admitting: Family Medicine

## 2016-11-11 ENCOUNTER — Ambulatory Visit (INDEPENDENT_AMBULATORY_CARE_PROVIDER_SITE_OTHER): Payer: BLUE CROSS/BLUE SHIELD | Admitting: Family Medicine

## 2016-11-11 VITALS — BP 118/82 | HR 78 | Temp 98.1°F | Resp 16 | Ht 70.0 in | Wt 216.5 lb

## 2016-11-11 DIAGNOSIS — Z0000 Encounter for general adult medical examination without abnormal findings: Secondary | ICD-10-CM

## 2016-11-11 LAB — CBC WITH DIFFERENTIAL/PLATELET
BASOS PCT: 0.3 % (ref 0.0–3.0)
Basophils Absolute: 0 10*3/uL (ref 0.0–0.1)
Eosinophils Absolute: 0.4 10*3/uL (ref 0.0–0.7)
Eosinophils Relative: 5.1 % — ABNORMAL HIGH (ref 0.0–5.0)
HEMATOCRIT: 48.6 % (ref 39.0–52.0)
Hemoglobin: 16.8 g/dL (ref 13.0–17.0)
LYMPHS PCT: 21.3 % (ref 12.0–46.0)
Lymphs Abs: 1.8 10*3/uL (ref 0.7–4.0)
MCHC: 34.5 g/dL (ref 30.0–36.0)
MCV: 98.4 fl (ref 78.0–100.0)
MONOS PCT: 10.3 % (ref 3.0–12.0)
Monocytes Absolute: 0.8 10*3/uL (ref 0.1–1.0)
NEUTROS ABS: 5.2 10*3/uL (ref 1.4–7.7)
Neutrophils Relative %: 63 % (ref 43.0–77.0)
PLATELETS: 162 10*3/uL (ref 150.0–400.0)
RBC: 4.94 Mil/uL (ref 4.22–5.81)
RDW: 13.4 % (ref 11.5–15.5)
WBC: 8.2 10*3/uL (ref 4.0–10.5)

## 2016-11-11 LAB — BASIC METABOLIC PANEL
BUN: 34 mg/dL — ABNORMAL HIGH (ref 6–23)
CALCIUM: 9.5 mg/dL (ref 8.4–10.5)
CO2: 32 mEq/L (ref 19–32)
CREATININE: 1.26 mg/dL (ref 0.40–1.50)
Chloride: 103 mEq/L (ref 96–112)
GFR: 62.9 mL/min (ref >60.00)
Glucose, Bld: 109 mg/dL — ABNORMAL HIGH (ref 70–99)
Potassium: 4.2 mEq/L (ref 3.5–5.1)
SODIUM: 143 meq/L (ref 135–145)

## 2016-11-11 LAB — LIPID PANEL
CHOL/HDL RATIO: 3
Cholesterol: 187 mg/dL (ref 0–200)
HDL: 59.5 mg/dL (ref >39.00)
LDL CALC: 114 mg/dL — AB (ref 0–99)
NONHDL: 127.9
Triglycerides: 69 mg/dL (ref 0.0–149.0)
VLDL: 13.8 mg/dL (ref 0.0–40.0)

## 2016-11-11 LAB — PSA: PSA: 2.11 ng/mL (ref 0.10–4.00)

## 2016-11-11 LAB — HEPATIC FUNCTION PANEL
ALT: 17 U/L (ref 0–53)
AST: 20 U/L (ref 0–37)
Albumin: 4.3 g/dL (ref 3.5–5.2)
Alkaline Phosphatase: 137 U/L — ABNORMAL HIGH (ref 39–117)
BILIRUBIN DIRECT: 0.1 mg/dL (ref 0.0–0.3)
BILIRUBIN TOTAL: 0.6 mg/dL (ref 0.2–1.2)
Total Protein: 6.8 g/dL (ref 6.0–8.3)

## 2016-11-11 LAB — TSH: TSH: 1.43 u[IU]/mL (ref 0.35–4.50)

## 2016-11-11 MED ORDER — ALLOPURINOL 300 MG PO TABS: 300.0000 mg | ORAL_TABLET | Freq: Every day | ORAL | 1 refills | Status: DC

## 2016-11-11 MED ORDER — CARVEDILOL 12.5 MG PO TABS: 12.5000 mg | ORAL_TABLET | Freq: Two times a day (BID) | ORAL | 6 refills | Status: DC

## 2016-11-11 MED ORDER — TELMISARTAN-HCTZ 80-12.5 MG PO TABS: 1.0000 | ORAL_TABLET | Freq: Every day | ORAL | 1 refills | Status: DC

## 2016-11-11 NOTE — Progress Notes (Signed)
   Subjective:    Patient ID: Keith Haynes, male    DOB: 12-31-1960, 55 y.o.   MRN: [id number]  HPI CPE- pt is UTD on colonoscopy (due 2022), UTD on Tdap.  He has lost 9 lbs since last visit and 42 lbs in 2 yrs!   Review of Systems Patient reports no vision/hearing changes, anorexia, fever ,adenopathy, persistant/recurrent hoarseness, swallowing issues, chest pain, palpitations, edema, persistant/recurrent cough, hemoptysis, dyspnea (rest,exertional, paroxysmal nocturnal), gastrointestinal  bleeding (melena, rectal bleeding), abdominal pain, excessive heart burn, GU symptoms (dysuria, hematuria, voiding/incontinence issues) syncope, focal weakness, memory loss, numbness & tingling, skin/hair/nail changes, depression, anxiety, abnormal bruising/bleeding, musculoskeletal symptoms/signs.     Objective:   Physical Exam General Appearance:    Alert, cooperative, no distress, appears stated age  Head:    Normocephalic, without obvious abnormality, atraumatic  Eyes:    PERRL, conjunctiva/corneas clear, EOM's intact, fundi    benign, both eyes       Ears:    Normal TM's and external ear canals, both ears  Nose:   Nares normal, septum midline, mucosa normal, no drainage   or sinus tenderness  Throat:   Lips, mucosa, and tongue normal; teeth and gums normal  Neck:   Supple, symmetrical, trachea midline, no adenopathy;       thyroid:  No enlargement/tenderness/nodules  Back:     Symmetric, no curvature, ROM normal, no CVA tenderness  Lungs:     Clear to auscultation bilaterally, respirations unlabored  Chest wall:    No tenderness or deformity  Heart:    Regular rate and rhythm, S1 and S2 normal, no murmur, rub   or gallop  Abdomen:     Soft, non-tender, bowel sounds active all four quadrants,    no masses, no organomegaly  Genitalia:    Deferred at pt's request  Rectal:    Extremities:   Extremities normal, atraumatic, no cyanosis or edema  Pulses:   2+ and symmetric all extremities  Skin:    Skin color, texture, turgor normal, no rashes or lesions  Lymph nodes:   Cervical, supraclavicular, and axillary nodes normal  Neurologic:   CNII-XII intact. Normal strength, sensation and reflexes      throughout          Assessment & Plan:

## 2016-11-11 NOTE — Patient Instructions (Signed)
Follow up in 6 months to recheck BP We'll notify you of your lab results and make any changes if needed Continue to work on healthy diet and regular exercise- you look great!!! Call with any questions or concerns Happy Holidays!!!

## 2016-11-11 NOTE — Assessment & Plan Note (Signed)
Pt's PE WNL.  UTD on colonoscopy, Tdap.  Declines flu shot.  Deferred GU today at pt's request.  Check labs.  Anticipatory guidance provided.

## 2016-11-11 NOTE — Progress Notes (Signed)
Pre visit review using our clinic review tool, if applicable. No additional management support is needed unless otherwise documented below in the visit note. 

## 2016-11-12 ENCOUNTER — Other Ambulatory Visit (INDEPENDENT_AMBULATORY_CARE_PROVIDER_SITE_OTHER): Payer: BLUE CROSS/BLUE SHIELD

## 2016-11-12 DIAGNOSIS — R748 Abnormal levels of other serum enzymes: Secondary | ICD-10-CM

## 2016-11-12 LAB — GAMMA GT: GGT: 30 U/L (ref 7–51)

## 2017-01-04 ENCOUNTER — Other Ambulatory Visit: Payer: Self-pay | Admitting: Family Medicine

## 2017-03-10 ENCOUNTER — Other Ambulatory Visit: Payer: Self-pay | Admitting: Family Medicine

## 2017-03-10 DIAGNOSIS — I10 Essential (primary) hypertension: Secondary | ICD-10-CM

## 2017-04-07 DIAGNOSIS — Z85820 Personal history of malignant melanoma of skin: Secondary | ICD-10-CM | POA: Diagnosis not present

## 2017-04-07 DIAGNOSIS — Z08 Encounter for follow-up examination after completed treatment for malignant neoplasm: Secondary | ICD-10-CM | POA: Diagnosis not present

## 2017-04-07 DIAGNOSIS — C4441 Basal cell carcinoma of skin of scalp and neck: Secondary | ICD-10-CM | POA: Diagnosis not present

## 2017-04-07 DIAGNOSIS — L570 Actinic keratosis: Secondary | ICD-10-CM | POA: Diagnosis not present

## 2017-05-04 ENCOUNTER — Encounter: Payer: Self-pay | Admitting: Family Medicine

## 2017-05-04 ENCOUNTER — Ambulatory Visit (INDEPENDENT_AMBULATORY_CARE_PROVIDER_SITE_OTHER): Payer: BLUE CROSS/BLUE SHIELD | Admitting: Family Medicine

## 2017-05-04 VITALS — BP 120/80 | HR 70 | Temp 98.0°F | Resp 16 | Ht 70.0 in | Wt 212.0 lb

## 2017-05-04 DIAGNOSIS — I10 Essential (primary) hypertension: Secondary | ICD-10-CM

## 2017-05-04 DIAGNOSIS — E669 Obesity, unspecified: Secondary | ICD-10-CM | POA: Diagnosis not present

## 2017-05-04 MED ORDER — TELMISARTAN-HCTZ 80-12.5 MG PO TABS: 1.0000 | ORAL_TABLET | Freq: Every day | ORAL | 1 refills | Status: DC

## 2017-05-04 MED ORDER — ALLOPURINOL 300 MG PO TABS: 300.0000 mg | ORAL_TABLET | Freq: Every day | ORAL | 1 refills | Status: DC

## 2017-05-04 NOTE — Patient Instructions (Signed)
Schedule your complete physical in 6 months We'll notify you of your lab results and make any changes if needed Keep up the good work on healthy diet and regular exercise- you look great! Call with any questions or concerns Have a great time at the beach!!!

## 2017-05-04 NOTE — Assessment & Plan Note (Signed)
Chronic problem.  Well controlled today.  He does report intermittently feeling lightheaded and having leg cramps.  I suspect this is due to his Lasix.  We will stop this and use only as needed for intermittent swelling.  Pt expressed understanding and is in agreement w/ plan.

## 2017-05-04 NOTE — Progress Notes (Signed)
Pre visit review using our clinic review tool, if applicable. No additional management support is needed unless otherwise documented below in the visit note. 

## 2017-05-04 NOTE — Progress Notes (Signed)
   Subjective:    Patient ID: [name], male    DOB: Jul 14, 1961, 56 y.o.   MRN: [id number]  HPI HTN- chronic problem.  On Telmisartan HCTZ 80/12.5mg  and Lasix 20mg  daily.  Pt has lost 5 lbs since last visit by changing diet- 45 lbs overall.  No CP, SOB, HAs, visual changes, edema.   Review of Systems For ROS see HPI     Objective:   Physical Exam  Constitutional: He is oriented to person, place, and time. He appears well-developed and well-nourished. No distress.  HENT:  Head: Normocephalic and atraumatic.  Eyes: Conjunctivae and EOM are normal. Pupils are equal, round, and reactive to light.  Neck: Normal range of motion. Neck supple. No thyromegaly present.  Cardiovascular: Normal rate, regular rhythm, normal heart sounds and intact distal pulses.   No murmur heard. Pulmonary/Chest: Effort normal and breath sounds normal. No respiratory distress.  Abdominal: Soft. Bowel sounds are normal. He exhibits no distension.  Musculoskeletal: He exhibits no edema.  Lymphadenopathy:    He has no cervical adenopathy.  Neurological: He is alert and oriented to person, place, and time. No cranial nerve deficit.  Skin: Skin is warm and dry.  Psychiatric: He has a normal mood and affect. His behavior is normal.  Vitals reviewed.         Assessment & Plan:

## 2017-05-04 NOTE — Assessment & Plan Note (Signed)
Pt has lost 45 lbs since changing his diet 18 months ago.  Applauded his efforts.  No changes at this time.  Will follow.

## 2017-05-05 LAB — HEPATIC FUNCTION PANEL
ALBUMIN: 4.8 g/dL (ref 3.5–5.2)
ALT: 17 U/L (ref 0–53)
AST: 21 U/L (ref 0–37)
Alkaline Phosphatase: 132 U/L — ABNORMAL HIGH (ref 39–117)
Bilirubin, Direct: 0.2 mg/dL (ref 0.0–0.3)
TOTAL PROTEIN: 7.3 g/dL (ref 6.0–8.3)
Total Bilirubin: 0.9 mg/dL (ref 0.2–1.2)

## 2017-05-05 LAB — CBC WITH DIFFERENTIAL/PLATELET
BASOS ABS: 0 10*3/uL (ref 0.0–0.1)
Basophils Relative: 0.9 % (ref 0.0–3.0)
EOS ABS: 0.2 10*3/uL (ref 0.0–0.7)
Eosinophils Relative: 3.1 % (ref 0.0–5.0)
HCT: 51.1 % (ref 39.0–52.0)
Hemoglobin: 17.3 g/dL — ABNORMAL HIGH (ref 13.0–17.0)
LYMPHS ABS: 1 10*3/uL (ref 0.7–4.0)
LYMPHS PCT: 19.2 % (ref 12.0–46.0)
MCHC: 33.9 g/dL (ref 30.0–36.0)
MCV: 99.7 fl (ref 78.0–100.0)
Monocytes Absolute: 0.6 10*3/uL (ref 0.1–1.0)
Monocytes Relative: 11 % (ref 3.0–12.0)
NEUTROS ABS: 3.4 10*3/uL (ref 1.4–7.7)
NEUTROS PCT: 65.8 % (ref 43.0–77.0)
PLATELETS: 157 10*3/uL (ref 150.0–400.0)
RBC: 5.13 Mil/uL (ref 4.22–5.81)
RDW: 14.1 % (ref 11.5–15.5)
WBC: 5.1 10*3/uL (ref 4.0–10.5)

## 2017-05-05 LAB — LIPID PANEL
CHOLESTEROL: 214 mg/dL — AB (ref 0–200)
HDL: 68.3 mg/dL (ref >39.00)
LDL Cholesterol: 129 mg/dL — ABNORMAL HIGH (ref 0–99)
NonHDL: 145.25
TRIGLYCERIDES: 83 mg/dL (ref 0.0–149.0)
Total CHOL/HDL Ratio: 3
VLDL: 16.6 mg/dL (ref 0.0–40.0)

## 2017-05-05 LAB — BASIC METABOLIC PANEL
BUN: 34 mg/dL — ABNORMAL HIGH (ref 6–23)
CALCIUM: 10.3 mg/dL (ref 8.4–10.5)
CO2: 29 meq/L (ref 19–32)
CREATININE: 1.27 mg/dL (ref 0.40–1.50)
Chloride: 101 mEq/L (ref 96–112)
GFR: 62.22 mL/min (ref >60.00)
GLUCOSE: 106 mg/dL — AB (ref 70–99)
Potassium: 4.4 mEq/L (ref 3.5–5.1)
Sodium: 139 mEq/L (ref 135–145)

## 2017-05-26 IMAGING — CT CT RENAL STONE PROTOCOL
2 of 4 series · 16 of 46 positions shown, 18 images · non-contrast
Comparison: None.

CLINICAL DATA: Right flank pain

EXAM:
CT ABDOMEN AND PELVIS WITHOUT CONTRAST
TECHNIQUE: Multidetector CT imaging of the abdomen and pelvis was performed
following the standard protocol without IV contrast.

[Series 2: axial st · axial · 0.98mm/px · z∈[+433,+948]mm · 13 of 117 slices shown, 15 images]
[im 9/117  soft-tissue]
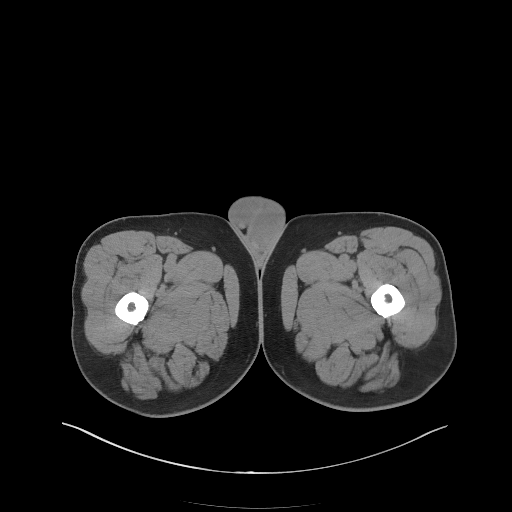
[im 9/117  bone]
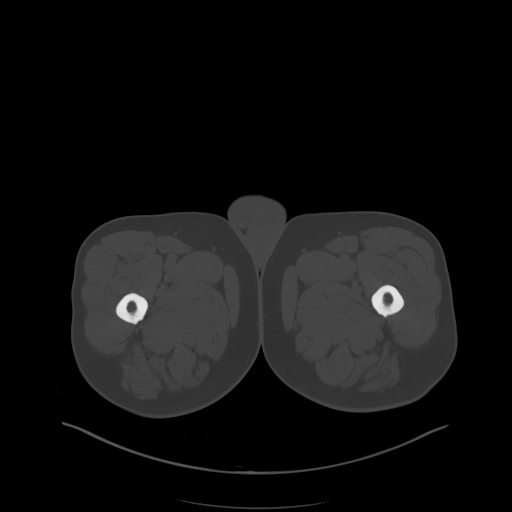
[im 18/117  soft-tissue]
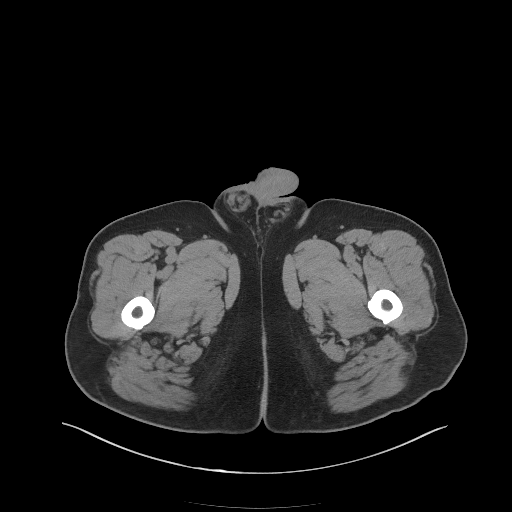
[im 26/117  soft-tissue]
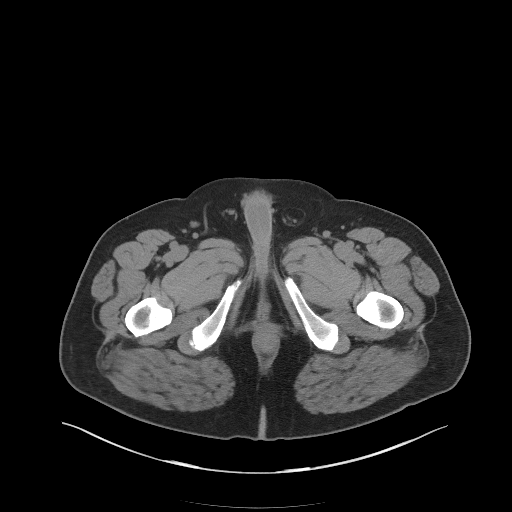
[im 35/117  soft-tissue]
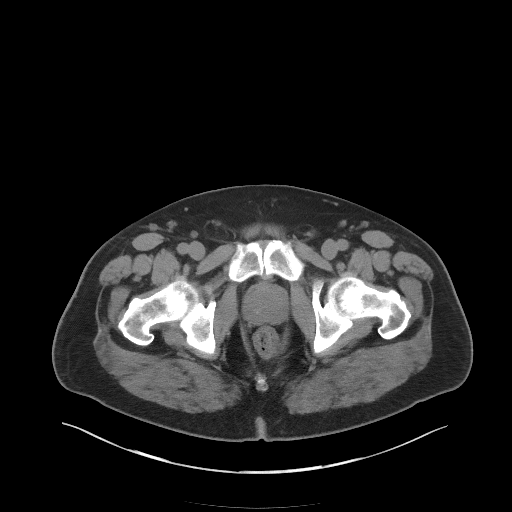
[im 43/117  soft-tissue]
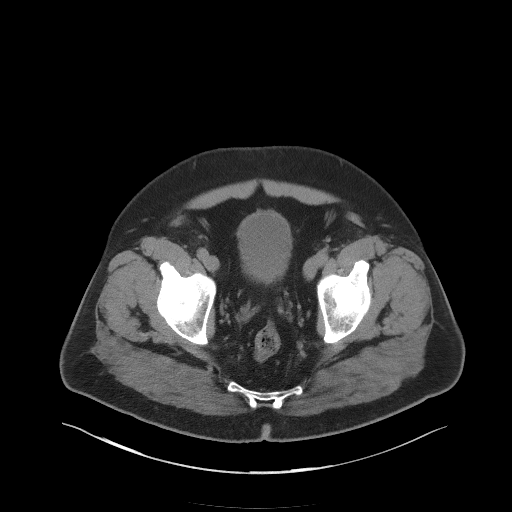
[im 52/117  soft-tissue]
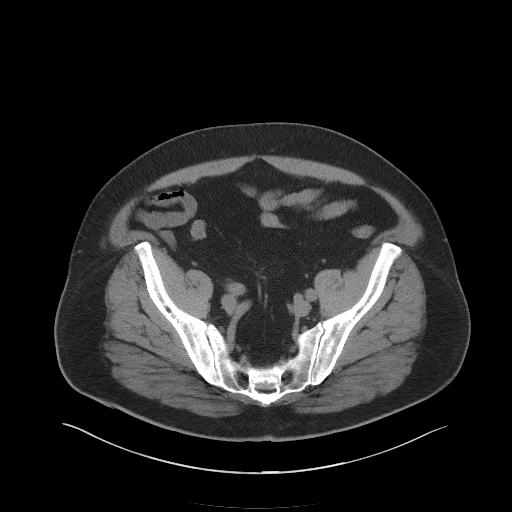
[im 61/117  soft-tissue]
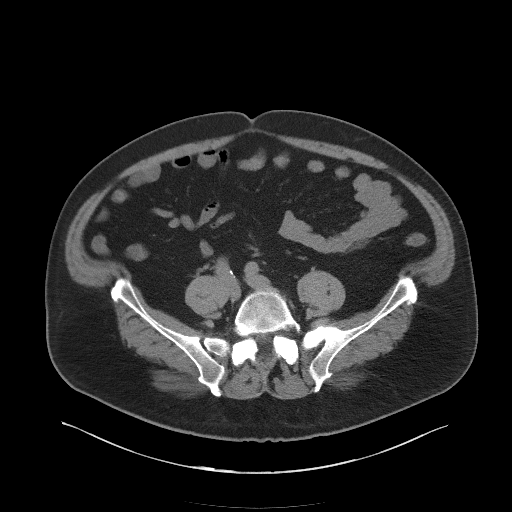
[im 69/117  soft-tissue]
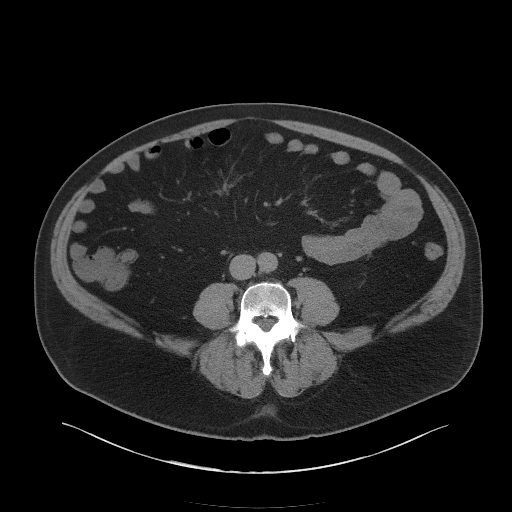
[im 78/117  soft-tissue]
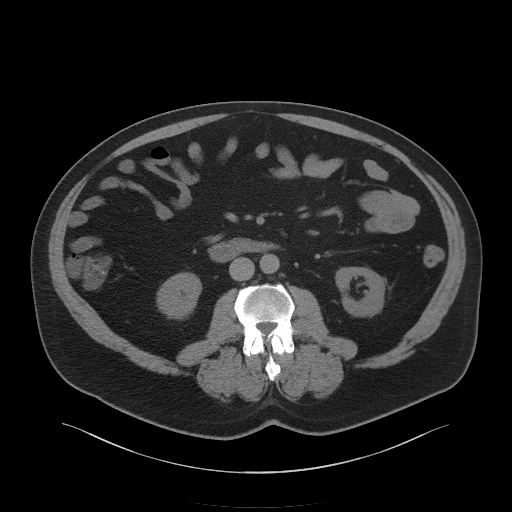
[im 78/117  bone]
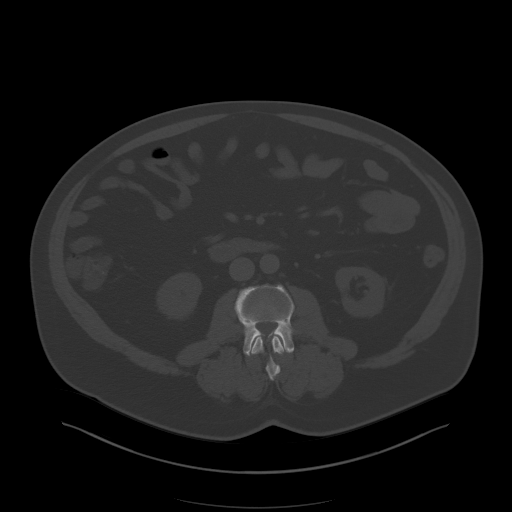
[im 86/117  soft-tissue]
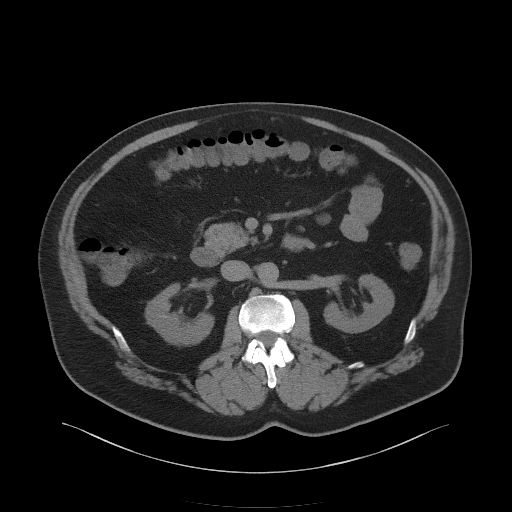
[im 95/117  soft-tissue]
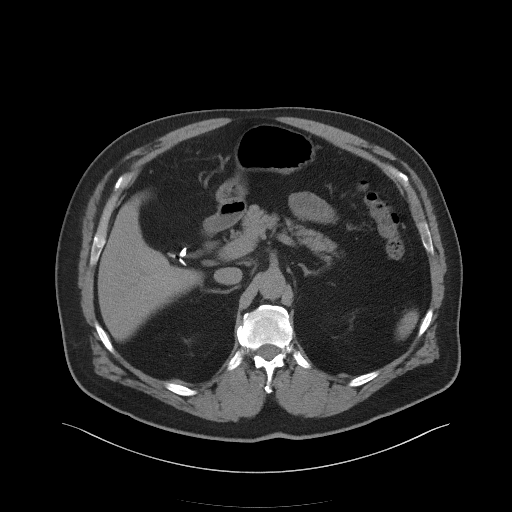
[im 104/117  soft-tissue]
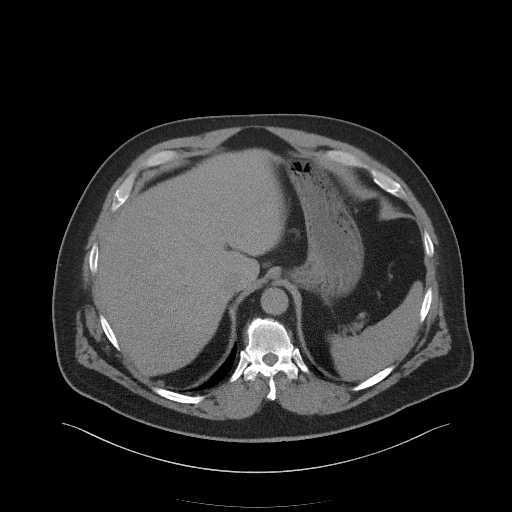
[im 112/117  soft-tissue]
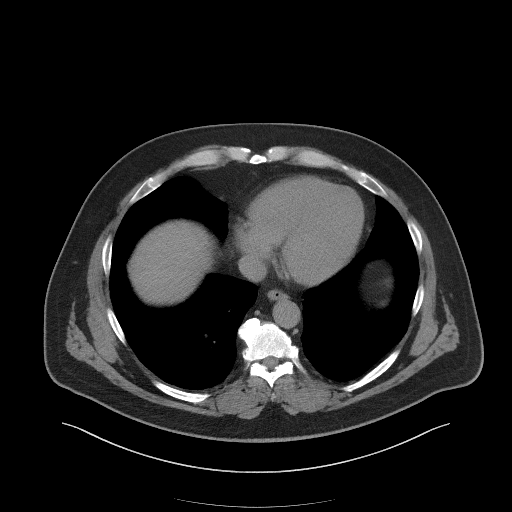

[Series 4: coronal st · coronal · 0.92mm/px · 3 of 114 slices shown]
[im 38/114  soft-tissue]
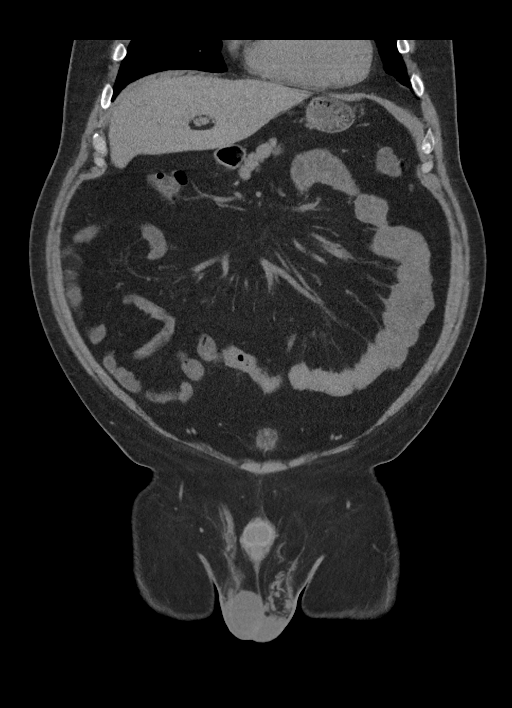
[im 51/114  soft-tissue]
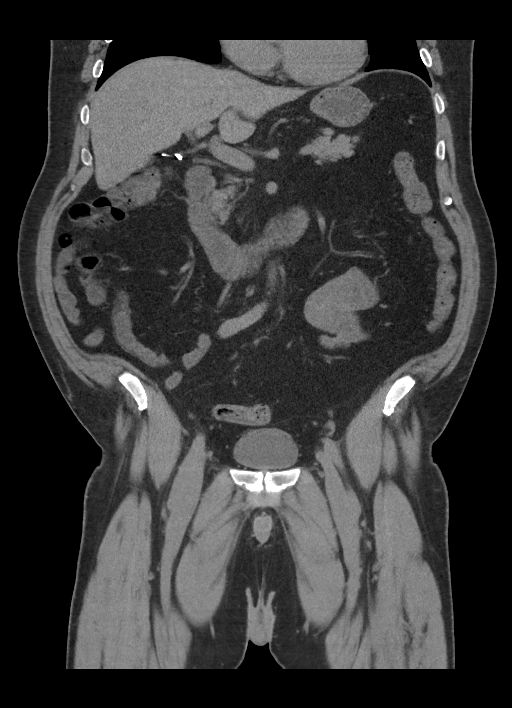
[im 63/114  soft-tissue]
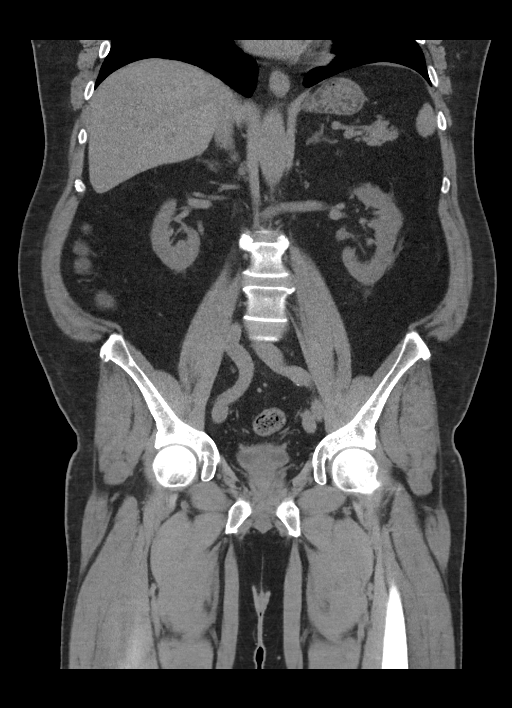

[16 of 46 positions shown; findings below may reference images not displayed]

FINDINGS: Lower chest:  Unremarkable

Hepatobiliary: Cholecystectomy.

Pancreas: Unremarkable

Spleen: Unremarkable

Adrenals/Urinary Tract: 3 mm left kidney upper pole nonobstructive
calculus. No ureteral or bladder calculus. Median lobe of the
prostate mildly indents the bladder base. Suspected small exophytic
renal cyst along the left posterior mid kidney. Adrenal glands
normal.

Stomach/Bowel: Unremarkable

Vascular/Lymphatic: Mild aortoiliac atherosclerotic vascular
disease.

Small porta hepatis lymph nodes are not pathologically enlarged by
size criteria.

Reproductive: Mildly enlarged prostate gland indents the bladder
base.

Other: No supplemental non-categorized findings.

Musculoskeletal: No right lower rib fracture is seen. Mild
degenerative arthropathy of both hips with loss of articular space.
Bridging spurring of the left sacroiliac joint.

There is mild right foraminal stenosis at L4-5 due to intervertebral
and facet spurring along with disc bulge.
IMPRESSION: 1. A specific cause for right flank pain is not seen.
2. 3 mm left kidney upper pole nonobstructive calculus.
3. Mild right foraminal stenosis at L4-5 due to spurring and
degenerative disc disease.
4. Mild degenerative arthropathy of both hips.
5. Mild atherosclerosis.
6. Enlarged prostate gland indents the bladder base.

## 2017-08-05 ENCOUNTER — Encounter: Payer: Self-pay | Admitting: Family Medicine

## 2017-08-06 ENCOUNTER — Encounter: Payer: Self-pay | Admitting: *Deleted

## 2017-08-06 ENCOUNTER — Telehealth: Payer: Self-pay | Admitting: *Deleted

## 2017-08-06 MED ORDER — COLCHICINE 0.6 MG PO TABS: 0.6000 mg | ORAL_TABLET | Freq: Two times a day (BID) | ORAL | 1 refills | Status: DC

## 2017-08-06 NOTE — Telephone Encounter (Signed)
PA started through cover my meds for Colchicine 0.6mg .   KEY: [id number]  Patient notified via mychart that PA has been submitted.

## 2017-08-09 NOTE — Telephone Encounter (Signed)
Received letter from [facility] denying medication.  States that the only alternative to try is Colcrys.   Routed to provider to advise.

## 2017-08-10 NOTE — Addendum Note (Signed)
Addended by: Katina Dung on: 08/10/2017 10:59 AM   Modules accepted: Orders

## 2017-08-10 NOTE — Telephone Encounter (Signed)
There is an email from patient stating that he cannot take the Colcrys - I have called patient to discuss because this is not in his history medications so I am not sure when he tried it and what the result was.  I have left a message asking patient to call me back to discuss.

## 2017-08-10 NOTE — Telephone Encounter (Signed)
Ok to switch to [employer] as this is just brand name colchicine

## 2017-08-10 NOTE — Telephone Encounter (Signed)
Patient trying a coupon for the medication to pay out of pocket for it.  If he needs a new RX sent to a different pharmacy he will call and let us know where to send the new one.   He is trying to find the cheapest pharmacy since he going to have to pay out of pocket for it.  Worked with patient on finding a coupon through [place].

## 2017-08-26 ENCOUNTER — Encounter: Payer: Self-pay | Admitting: Family Medicine

## 2017-08-26 ENCOUNTER — Other Ambulatory Visit: Payer: Self-pay | Admitting: Family Medicine

## 2017-08-26 DIAGNOSIS — R040 Epistaxis: Secondary | ICD-10-CM

## 2017-09-10 ENCOUNTER — Other Ambulatory Visit: Payer: Self-pay | Admitting: Family Medicine

## 2017-09-10 DIAGNOSIS — I10 Essential (primary) hypertension: Secondary | ICD-10-CM

## 2017-09-13 ENCOUNTER — Other Ambulatory Visit: Payer: Self-pay | Admitting: General Practice

## 2017-09-13 MED ORDER — TELMISARTAN-HCTZ 80-12.5 MG PO TABS: 1.0000 | ORAL_TABLET | Freq: Every day | ORAL | 1 refills | Status: DC

## 2017-10-11 DIAGNOSIS — Z08 Encounter for follow-up examination after completed treatment for malignant neoplasm: Secondary | ICD-10-CM | POA: Diagnosis not present

## 2017-10-11 DIAGNOSIS — C44311 Basal cell carcinoma of skin of nose: Secondary | ICD-10-CM | POA: Diagnosis not present

## 2017-10-11 DIAGNOSIS — Z85828 Personal history of other malignant neoplasm of skin: Secondary | ICD-10-CM | POA: Diagnosis not present

## 2017-10-11 DIAGNOSIS — L570 Actinic keratosis: Secondary | ICD-10-CM | POA: Diagnosis not present

## 2017-11-12 ENCOUNTER — Other Ambulatory Visit: Payer: Self-pay

## 2017-11-12 ENCOUNTER — Other Ambulatory Visit (INDEPENDENT_AMBULATORY_CARE_PROVIDER_SITE_OTHER): Payer: BLUE CROSS/BLUE SHIELD

## 2017-11-12 ENCOUNTER — Ambulatory Visit (INDEPENDENT_AMBULATORY_CARE_PROVIDER_SITE_OTHER): Payer: BLUE CROSS/BLUE SHIELD | Admitting: Family Medicine

## 2017-11-12 ENCOUNTER — Encounter: Payer: Self-pay | Admitting: Family Medicine

## 2017-11-12 VITALS — BP 122/78 | HR 79 | Temp 98.1°F | Resp 16 | Ht 70.0 in | Wt 215.2 lb

## 2017-11-12 DIAGNOSIS — M1A09X0 Idiopathic chronic gout, multiple sites, without tophus (tophi): Secondary | ICD-10-CM

## 2017-11-12 DIAGNOSIS — I10 Essential (primary) hypertension: Secondary | ICD-10-CM | POA: Diagnosis not present

## 2017-11-12 DIAGNOSIS — Z125 Encounter for screening for malignant neoplasm of prostate: Secondary | ICD-10-CM | POA: Diagnosis not present

## 2017-11-12 DIAGNOSIS — R748 Abnormal levels of other serum enzymes: Secondary | ICD-10-CM

## 2017-11-12 DIAGNOSIS — Z0000 Encounter for general adult medical examination without abnormal findings: Secondary | ICD-10-CM | POA: Diagnosis not present

## 2017-11-12 LAB — CBC WITH DIFFERENTIAL/PLATELET
Basophils Absolute: 0 10*3/uL (ref 0.0–0.1)
Basophils Relative: 0.7 % (ref 0.0–3.0)
EOS PCT: 9.2 % — AB (ref 0.0–5.0)
Eosinophils Absolute: 0.6 10*3/uL (ref 0.0–0.7)
HCT: 49.9 % (ref 39.0–52.0)
Hemoglobin: 17.3 g/dL — ABNORMAL HIGH (ref 13.0–17.0)
LYMPHS ABS: 1.6 10*3/uL (ref 0.7–4.0)
Lymphocytes Relative: 26 % (ref 12.0–46.0)
MCHC: 34.6 g/dL (ref 30.0–36.0)
MCV: 99.6 fl (ref 78.0–100.0)
MONO ABS: 0.8 10*3/uL (ref 0.1–1.0)
Monocytes Relative: 12.4 % — ABNORMAL HIGH (ref 3.0–12.0)
NEUTROS ABS: 3.1 10*3/uL (ref 1.4–7.7)
NEUTROS PCT: 51.7 % (ref 43.0–77.0)
PLATELETS: 162 10*3/uL (ref 150.0–400.0)
RBC: 5.01 Mil/uL (ref 4.22–5.81)
RDW: 13 % (ref 11.5–15.5)
WBC: 6.1 10*3/uL (ref 4.0–10.5)

## 2017-11-12 LAB — HEPATIC FUNCTION PANEL
ALBUMIN: 4.5 g/dL (ref 3.5–5.2)
ALK PHOS: 170 U/L — AB (ref 39–117)
ALT: 21 U/L (ref 0–53)
AST: 23 U/L (ref 0–37)
BILIRUBIN DIRECT: 0.1 mg/dL (ref 0.0–0.3)
TOTAL PROTEIN: 7.2 g/dL (ref 6.0–8.3)
Total Bilirubin: 0.7 mg/dL (ref 0.2–1.2)

## 2017-11-12 LAB — LIPID PANEL
CHOL/HDL RATIO: 4
Cholesterol: 180 mg/dL (ref 0–200)
HDL: 50.2 mg/dL (ref >39.00)
LDL Cholesterol: 115 mg/dL — ABNORMAL HIGH (ref 0–99)
NONHDL: 129.82
TRIGLYCERIDES: 73 mg/dL (ref 0.0–149.0)
VLDL: 14.6 mg/dL (ref 0.0–40.0)

## 2017-11-12 LAB — PSA: PSA: 2.55 ng/mL (ref 0.10–4.00)

## 2017-11-12 LAB — BASIC METABOLIC PANEL
BUN: 32 mg/dL — AB (ref 6–23)
CHLORIDE: 101 meq/L (ref 96–112)
CO2: 30 meq/L (ref 19–32)
Calcium: 10 mg/dL (ref 8.4–10.5)
Creatinine, Ser: 1.25 mg/dL (ref 0.40–1.50)
GFR: 63.25 mL/min (ref >60.00)
Glucose, Bld: 98 mg/dL (ref 70–99)
POTASSIUM: 3.8 meq/L (ref 3.5–5.1)
Sodium: 140 mEq/L (ref 135–145)

## 2017-11-12 LAB — TSH: TSH: 2.77 u[IU]/mL (ref 0.35–4.50)

## 2017-11-12 LAB — GAMMA GT: GGT: 33 U/L (ref 7–51)

## 2017-11-12 MED ORDER — COLCHICINE 0.6 MG PO TABS: 0.6000 mg | ORAL_TABLET | Freq: Every day | ORAL | 1 refills | Status: DC

## 2017-11-12 NOTE — Assessment & Plan Note (Signed)
Chronic problem.  Currently asymptomatic but pt doesn't feel Allopurinol is effective at preventing flares.  He has tried to go off the diuretics in the past but this has caused BP to 'go crazy'.  In the past, Colchrys has been ineffective.  Will switch back to once daily Colchicine for prevention.  Reviewed supportive care and red flags that should prompt return.  Pt expressed understanding and is in agreement w/ plan.

## 2017-11-12 NOTE — Progress Notes (Signed)
   Subjective:    Patient ID: Keith Haynes, male    DOB: 08-29-1961, 56 y.o.   MRN: [id number]  HPI CPE- UTD on colonoscopy, Tdap.  Declines flu.   Review of Systems Patient reports no vision/hearing changes, anorexia, fever ,adenopathy, persistant/recurrent hoarseness, swallowing issues, chest pain, palpitations, edema, persistant/recurrent cough, hemoptysis, dyspnea (rest,exertional, paroxysmal nocturnal), gastrointestinal  bleeding (melena, rectal bleeding), abdominal pain, excessive heart burn, GU symptoms (dysuria, hematuria, voiding/incontinence issues) syncope, focal weakness, memory loss, numbness & tingling, skin/hair/nail changes, depression, anxiety, abnormal bruising/bleeding.   + gout flare over the holidays- pt reports the allopurinol is not effective at preventing gout flares.  Previously on Colchicine daily w/ great control of sxs.  Colchrys was ineffective.    Objective:   Physical Exam General Appearance:    Alert, cooperative, no distress, appears stated age  Head:    Normocephalic, without obvious abnormality, atraumatic  Eyes:    PERRL, conjunctiva/corneas clear, EOM's intact, fundi    benign, both eyes       Ears:    Normal TM's and external ear canals, both ears  Nose:   Nares normal, septum midline, mucosa normal, no drainage   or sinus tenderness  Throat:   Lips, mucosa, and tongue normal; teeth and gums normal  Neck:   Supple, symmetrical, trachea midline, no adenopathy;       thyroid:  No enlargement/tenderness/nodules  Back:     Symmetric, no curvature, ROM normal, no CVA tenderness  Lungs:     Clear to auscultation bilaterally, respirations unlabored  Chest wall:    No tenderness or deformity  Heart:    Regular rate and rhythm, S1 and S2 normal, no murmur, rub   or gallop  Abdomen:     Soft, non-tender, bowel sounds active all four quadrants,    no masses, no organomegaly  Genitalia:    Pt declines GU exam  Rectal:    Extremities:   Extremities normal,  atraumatic, no cyanosis or edema  Pulses:   2+ and symmetric all extremities  Skin:   Skin color, texture, turgor normal, no rashes or lesions  Lymph nodes:   Cervical, supraclavicular, and axillary nodes normal  Neurologic:   CNII-XII intact. Normal strength, sensation and reflexes      throughout          Assessment & Plan:

## 2017-11-12 NOTE — Assessment & Plan Note (Signed)
Chronic problem.  Well controlled today.  Asymptomatic.  Check labs.  No anticipated med changes.  Will follow. 

## 2017-11-12 NOTE — Assessment & Plan Note (Signed)
Pt's PE WNL.  UTD on colonoscopy, Tdap.  Declines GU exam today.  Check labs.  Anticipatory guidance provided.

## 2017-11-12 NOTE — Patient Instructions (Signed)
Follow up in 6 months to recheck BP and cholesterol We'll notify you of your lab results and make any changes if needed Continue to work on healthy diet and regular exercise- you can do it! Start the Colchicine 1 tab daily Decrease the Allopurinol to 1/2 tab daily x1 week and then stop Call with any questions or concerns [place]! Happy Holidays!!!

## 2018-01-10 ENCOUNTER — Other Ambulatory Visit: Payer: Self-pay | Admitting: General Practice

## 2018-01-10 MED ORDER — COLCHICINE 0.6 MG PO TABS: 0.6000 mg | ORAL_TABLET | Freq: Every day | ORAL | 1 refills | Status: DC

## 2018-01-29 DIAGNOSIS — J0100 Acute maxillary sinusitis, unspecified: Secondary | ICD-10-CM | POA: Diagnosis not present

## 2018-02-01 ENCOUNTER — Encounter: Payer: Self-pay | Admitting: Family Medicine

## 2018-02-26 ENCOUNTER — Other Ambulatory Visit: Payer: Self-pay | Admitting: Family Medicine

## 2018-02-26 DIAGNOSIS — I10 Essential (primary) hypertension: Secondary | ICD-10-CM

## 2018-03-14 DIAGNOSIS — Z08 Encounter for follow-up examination after completed treatment for malignant neoplasm: Secondary | ICD-10-CM | POA: Diagnosis not present

## 2018-03-14 DIAGNOSIS — Z85820 Personal history of malignant melanoma of skin: Secondary | ICD-10-CM | POA: Diagnosis not present

## 2018-03-14 DIAGNOSIS — L570 Actinic keratosis: Secondary | ICD-10-CM | POA: Diagnosis not present

## 2018-04-25 ENCOUNTER — Encounter: Payer: Self-pay | Admitting: Family Medicine

## 2018-04-25 MED ORDER — COLCHICINE 0.6 MG PO TABS: 0.6000 mg | ORAL_TABLET | Freq: Two times a day (BID) | ORAL | 1 refills | Status: DC

## 2018-05-11 ENCOUNTER — Other Ambulatory Visit (INDEPENDENT_AMBULATORY_CARE_PROVIDER_SITE_OTHER): Payer: BLUE CROSS/BLUE SHIELD

## 2018-05-11 ENCOUNTER — Other Ambulatory Visit: Payer: Self-pay

## 2018-05-11 ENCOUNTER — Encounter: Payer: Self-pay | Admitting: Family Medicine

## 2018-05-11 ENCOUNTER — Ambulatory Visit: Payer: BLUE CROSS/BLUE SHIELD | Admitting: Family Medicine

## 2018-05-11 ENCOUNTER — Encounter: Payer: Self-pay | Admitting: General Practice

## 2018-05-11 VITALS — BP 150/100 | HR 78 | Temp 97.6°F | Resp 16 | Ht 69.0 in | Wt 227.6 lb

## 2018-05-11 DIAGNOSIS — R7309 Other abnormal glucose: Secondary | ICD-10-CM

## 2018-05-11 DIAGNOSIS — M1A09X0 Idiopathic chronic gout, multiple sites, without tophus (tophi): Secondary | ICD-10-CM | POA: Diagnosis not present

## 2018-05-11 DIAGNOSIS — I10 Essential (primary) hypertension: Secondary | ICD-10-CM

## 2018-05-11 DIAGNOSIS — E785 Hyperlipidemia, unspecified: Secondary | ICD-10-CM | POA: Diagnosis not present

## 2018-05-11 DIAGNOSIS — N529 Male erectile dysfunction, unspecified: Secondary | ICD-10-CM | POA: Diagnosis not present

## 2018-05-11 LAB — CBC WITH DIFFERENTIAL/PLATELET
BASOS PCT: 0.7 % (ref 0.0–3.0)
Basophils Absolute: 0 10*3/uL (ref 0.0–0.1)
EOS PCT: 6.2 % — AB (ref 0.0–5.0)
Eosinophils Absolute: 0.4 10*3/uL (ref 0.0–0.7)
HCT: 49.1 % (ref 39.0–52.0)
Hemoglobin: 16.9 g/dL (ref 13.0–17.0)
Lymphocytes Relative: 26.9 % (ref 12.0–46.0)
Lymphs Abs: 1.6 10*3/uL (ref 0.7–4.0)
MCHC: 34.5 g/dL (ref 30.0–36.0)
MCV: 98.3 fl (ref 78.0–100.0)
MONO ABS: 0.8 10*3/uL (ref 0.1–1.0)
Monocytes Relative: 13.4 % — ABNORMAL HIGH (ref 3.0–12.0)
Neutro Abs: 3.1 10*3/uL (ref 1.4–7.7)
Neutrophils Relative %: 52.8 % (ref 43.0–77.0)
Platelets: 146 10*3/uL — ABNORMAL LOW (ref 150.0–400.0)
RBC: 4.99 Mil/uL (ref 4.22–5.81)
RDW: 13.3 % (ref 11.5–15.5)
WBC: 5.9 10*3/uL (ref 4.0–10.5)

## 2018-05-11 LAB — BASIC METABOLIC PANEL
BUN: 28 mg/dL — AB (ref 6–23)
CALCIUM: 9.6 mg/dL (ref 8.4–10.5)
CO2: 30 mEq/L (ref 19–32)
Chloride: 103 mEq/L (ref 96–112)
Creatinine, Ser: 1.29 mg/dL (ref 0.40–1.50)
GFR: 60.89 mL/min (ref >60.00)
GLUCOSE: 114 mg/dL — AB (ref 70–99)
Potassium: 4 mEq/L (ref 3.5–5.1)
Sodium: 141 mEq/L (ref 135–145)

## 2018-05-11 LAB — HEPATIC FUNCTION PANEL
ALK PHOS: 148 U/L — AB (ref 39–117)
ALT: 21 U/L (ref 0–53)
AST: 22 U/L (ref 0–37)
Albumin: 4.3 g/dL (ref 3.5–5.2)
BILIRUBIN TOTAL: 0.7 mg/dL (ref 0.2–1.2)
Bilirubin, Direct: 0.1 mg/dL (ref 0.0–0.3)
Total Protein: 7.1 g/dL (ref 6.0–8.3)

## 2018-05-11 LAB — URIC ACID: URIC ACID, SERUM: 11 mg/dL — AB (ref 4.0–7.8)

## 2018-05-11 LAB — LIPID PANEL
Cholesterol: 187 mg/dL (ref 0–200)
HDL: 57.5 mg/dL (ref >39.00)
LDL Cholesterol: 111 mg/dL — ABNORMAL HIGH (ref 0–99)
NONHDL: 129.45
Total CHOL/HDL Ratio: 3
Triglycerides: 90 mg/dL (ref 0.0–149.0)
VLDL: 18 mg/dL (ref 0.0–40.0)

## 2018-05-11 LAB — HEMOGLOBIN A1C: Hgb A1c MFr Bld: 5.6 % (ref 4.6–6.5)

## 2018-05-11 MED ORDER — SILDENAFIL CITRATE 100 MG PO TABS: ORAL_TABLET | ORAL | 5 refills | Status: DC

## 2018-05-11 MED ORDER — AMLODIPINE BESYLATE 5 MG PO TABS: 5.0000 mg | ORAL_TABLET | Freq: Every day | ORAL | 3 refills | Status: DC

## 2018-05-11 MED ORDER — ALLOPURINOL 300 MG PO TABS
300.0000 mg | ORAL_TABLET | Freq: Every day | ORAL | 1 refills | Status: DC
Start: 2018-05-11 — End: 2018-10-26

## 2018-05-11 NOTE — Progress Notes (Signed)
   Subjective:    Patient ID: Keith Haynes, male    DOB: 06-07-1961, 57 y.o.   MRN: [id number]  HPI HTN- chronic problem, on Telmisartan HCTZ 80/12.5mg  daily and Lasix 20mg  daily.  BP is much worse today than last reading (122/78).  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  No CP, SOB, HAs, visual changes, edema.  Hyperlipidemia- chronic problem.  Was attempting to control w/ diet and exercise but he has gained 13 lbs since last visit.  No abd pain, N/V.  Gout- chronic problem, on Allopurinol 300mg  daily as prophylaxis but stopped taking when he ran out.  Pt reports if he takes a full Lasix develops gout.  Ok if 1/2 tab.   Review of Systems For ROS see HPI     Objective:   Physical Exam  Constitutional: He is oriented to person, place, and time. He appears well-developed and well-nourished. No distress.  obese  HENT:  Head: Normocephalic and atraumatic.  Eyes: Pupils are equal, round, and reactive to light. Conjunctivae and EOM are normal.  Neck: Normal range of motion. Neck supple. No thyromegaly present.  Cardiovascular: Normal rate, regular rhythm, normal heart sounds and intact distal pulses.  No murmur heard. Pulmonary/Chest: Effort normal and breath sounds normal. No respiratory distress.  Abdominal: Soft. Bowel sounds are normal. He exhibits no distension.  Musculoskeletal: He exhibits no edema.  Lymphadenopathy:    He has no cervical adenopathy.  Neurological: He is alert and oriented to person, place, and time. No cranial nerve deficit.  Skin: Skin is warm and dry.  Psychiatric: He has a normal mood and affect. His behavior is normal.  Vitals reviewed.         Assessment & Plan:

## 2018-05-11 NOTE — Assessment & Plan Note (Signed)
Chronic problem.  Attempting to control w/ diet and exercise but he has gained 13 lbs.  Stressed need for healthy diet and regular exercise.  Check labs.  Start meds prn.

## 2018-05-11 NOTE — Assessment & Plan Note (Signed)
Deteriorated.  Pt stopped his Allopurinol when he ran out.  Stressed need for him to continue- refill provided.  Check uric acid level.  Will follow.

## 2018-05-11 NOTE — Assessment & Plan Note (Signed)
Deteriorated.  BP is much higher today than last visit.  He denies any changes to medications.  Is up 13 lbs since last visit.  Discussed stopping HCTZ due to gout but pt prefers to continue at this time.  Add Amlodipine to lower BP.  Will follow closely.

## 2018-05-11 NOTE — Patient Instructions (Signed)
Follow up in 1 month to recheck BP We'll notify you of your lab results and make any changes if needed RESTART the Allopurinol daily ADD the Amlodipine 5mg  daily CONTINUE the Telmisartan HCT daily 1/2 tab of Furosemide as needed for swelling Drink LOTS of water! Call with any questions or concerns Hang in there!!!

## 2018-06-10 ENCOUNTER — Ambulatory Visit: Payer: BLUE CROSS/BLUE SHIELD | Admitting: Family Medicine

## 2018-06-10 ENCOUNTER — Other Ambulatory Visit: Payer: Self-pay

## 2018-06-10 ENCOUNTER — Encounter: Payer: Self-pay | Admitting: Family Medicine

## 2018-06-10 VITALS — BP 140/90 | HR 77 | Temp 98.1°F | Resp 16 | Ht 69.0 in | Wt 229.1 lb

## 2018-06-10 DIAGNOSIS — I10 Essential (primary) hypertension: Secondary | ICD-10-CM | POA: Diagnosis not present

## 2018-06-10 MED ORDER — AMLODIPINE BESYLATE 10 MG PO TABS: 10.0000 mg | ORAL_TABLET | Freq: Every day | ORAL | 3 refills | Status: DC

## 2018-06-10 NOTE — Progress Notes (Signed)
   Subjective:    Patient ID: Keith Haynes, male    DOB: [birth year], 57 y.o.   MRN: [id number]  HPI HTN- chronic problem, on Telmisartan HCTZ 80/12.5mg  daily.  At last visit Amlodipine was added.  BP has decreased by 10 points- both systolic and diastolic.  Pt reports home BPs have been 130s/80s.  No side effects from addition of Amlodipine.  No CP, SOB, HAs, visual changes, abd pain, N/V.   Review of Systems For ROS see HPI     Objective:   Physical Exam  Constitutional: He is oriented to person, place, and time. He appears well-developed and well-nourished. No distress.  obese  HENT:  Head: Normocephalic and atraumatic.  Eyes: Pupils are equal, round, and reactive to light. Conjunctivae and EOM are normal.  Neck: Normal range of motion. Neck supple. No thyromegaly present.  Cardiovascular: Normal rate, regular rhythm, normal heart sounds and intact distal pulses.  No murmur heard. Pulmonary/Chest: Effort normal and breath sounds normal. No respiratory distress.  Abdominal: Soft. Bowel sounds are normal. He exhibits no distension.  Musculoskeletal: He exhibits no edema.  Lymphadenopathy:    He has no cervical adenopathy.  Neurological: He is alert and oriented to person, place, and time. No cranial nerve deficit.  Skin: Skin is warm and dry.  Psychiatric: He has a normal mood and affect. His behavior is normal.  Vitals reviewed.         Assessment & Plan:

## 2018-06-10 NOTE — Assessment & Plan Note (Signed)
Ongoing issue.  BP has improved by 10 points both systolic and diastolic but still not at goal.  Asymptomatic.  Increase Amlodipine to 10mg  daily.  Reviewed supportive care and red flags that should prompt return.  Pt expressed understanding and is in agreement w/ plan.

## 2018-06-10 NOTE — Patient Instructions (Signed)
Follow up in 1 month to recheck BP INCREASE the Amlodipine to 10mg  daily- 2 of what you have at home and 1 of the new prescription Continue to limit your salt intake and drink plenty of water Continue the Telmisartan HCTZ daily Call with any questions or concerns Happy 4th!!!

## 2018-06-21 ENCOUNTER — Other Ambulatory Visit: Payer: Self-pay | Admitting: Family Medicine

## 2018-06-24 DIAGNOSIS — H2513 Age-related nuclear cataract, bilateral: Secondary | ICD-10-CM | POA: Diagnosis not present

## 2018-07-08 ENCOUNTER — Encounter: Payer: Self-pay | Admitting: Physician Assistant

## 2018-07-08 ENCOUNTER — Ambulatory Visit: Payer: BLUE CROSS/BLUE SHIELD | Admitting: Physician Assistant

## 2018-07-08 ENCOUNTER — Other Ambulatory Visit: Payer: Self-pay

## 2018-07-08 VITALS — BP 130/82 | HR 88 | Temp 98.0°F | Resp 16 | Ht 69.0 in | Wt 233.0 lb

## 2018-07-08 DIAGNOSIS — M1A09X0 Idiopathic chronic gout, multiple sites, without tophus (tophi): Secondary | ICD-10-CM | POA: Diagnosis not present

## 2018-07-08 DIAGNOSIS — I10 Essential (primary) hypertension: Secondary | ICD-10-CM | POA: Diagnosis not present

## 2018-07-08 NOTE — Progress Notes (Signed)
Patient presents to clinic today for follow-up of hypertension. At last visit with PCP amlodipine was increased to 10 mg daily. Patient endorses taking medication as directed and tolerating well without side effect. Has been checking his BP measurements some at home but notes he does them first thing in the morning before taking his medication, getting measurements in the 73A-193X systolic. Patient denies chest pain, palpitations, lightheadedness, dizziness, vision changes or frequent headaches.  BP Readings from Last 3 Encounters:  07/08/18 130/82  06/10/18 140/90  05/11/18 (!) 150/100   Patient also notes redness and warmth in his R knee starting 2 days ago associated with swelling and tenderness. Patient has a big history of gout, currently on allopurinol 300 mg daily and taking as directed. States he started his colchicine this morning with some improvement. Denies fever, chills, nausea/vomiting.   Past Medical History:  Diagnosis Date  . Gout   . History of chicken pox   . Hypertension     Current Outpatient Medications on File Prior to Visit  Medication Sig Dispense Refill  . allopurinol (ZYLOPRIM) 300 MG tablet Take 1 tablet (300 mg total) by mouth daily. 90 tablet 1  . amLODipine (NORVASC) 10 MG tablet Take 1 tablet (10 mg total) by mouth daily. 30 tablet 3  . colchicine 0.6 MG tablet Take 1 tablet (0.6 mg total) by mouth 2 (two) times daily. 180 tablet 1  . furosemide (LASIX) 20 MG tablet TAKE 1 TABLET (20 MG TOTAL) BY MOUTH DAILY. 90 tablet 1  . LOTEMAX 0.5 % ophthalmic suspension INSTILL 1 DROP INTO LEFT EYE 4 TIMES A DAY AS DIRECTED  0  . sildenafil (VIAGRA) 100 MG tablet TAKE 1/2 TO 1 TABLET (50-100 MG TOTAL) BY MOUTH DAILY AS NEEDED FOR ERECTILE DYSFUNCTION. 10 tablet 5  . telmisartan-hydrochlorothiazide (MICARDIS HCT) 80-12.5 MG tablet TAKE 1 TABLET BY MOUTH EVERY DAY 90 tablet 1   No current facility-administered medications on file prior to visit.     No Known  Allergies  Family History  Problem Relation Age of Onset  . Hypertension Mother   . Diabetes Father     Social History   Socioeconomic History  . Marital status: Married    Spouse name: Not on file  . Number of children: Not on file  . Years of education: Not on file  . Highest education level: Not on file  Occupational History  . Not on file  Social Needs  . Financial resource strain: Not on file  . Food insecurity:    Worry: Not on file    Inability: Not on file  . Transportation needs:    Medical: Not on file    Non-medical: Not on file  Tobacco Use  . Smoking status: Never Smoker  . Smokeless tobacco: Never Used  Substance and Sexual Activity  . Alcohol use: Yes  . Drug use: No  . Sexual activity: Not on file  Lifestyle  . Physical activity:    Days per week: Not on file    Minutes per session: Not on file  . Stress: Not on file  Relationships  . Social connections:    Talks on phone: Not on file    Gets together: Not on file    Attends religious service: Not on file    Active member of club or organization: Not on file    Attends meetings of clubs or organizations: Not on file    Relationship status: Not on file  Other Topics Concern  .  Not on file  Social History Narrative  . Not on file   Review of Systems - See HPI.  All other ROS are negative.  BP 130/82   Pulse 88   Temp 98 F (36.7 C) (Oral)   Resp 16   Ht 5\' 9"  (1.753 m)   Wt 233 lb (105.7 kg)   SpO2 96%   BMI 34.41 kg/m   Physical Exam  Constitutional: He is oriented to person, place, and time. He appears well-developed and well-nourished.  HENT:  Head: Normocephalic and atraumatic.  Eyes: Conjunctivae are normal.  Neck: Neck supple.  Cardiovascular: Normal rate, regular rhythm and normal heart sounds.  Pulmonary/Chest: Effort normal.  Musculoskeletal:       Right knee: He exhibits swelling and erythema (mild erythema and warmth). Tenderness found. No medial joint line, no lateral  joint line, no MCL, no LCL and no patellar tendon tenderness noted.  Neurological: He is alert and oriented to person, place, and time.  Psychiatric: He has a normal mood and affect.  Vitals reviewed.   Recent Results (from the past 2160 hour(s))  Basic metabolic panel     Status: Abnormal   Collection Time: 05/11/18  8:27 AM  Result Value Ref Range   Sodium 141 135 - 145 mEq/L   Potassium 4.0 3.5 - 5.1 mEq/L   Chloride 103 96 - 112 mEq/L   CO2 30 19 - 32 mEq/L   Glucose, Bld 114 (H) 70 - 99 mg/dL   BUN 28 (H) 6 - 23 mg/dL   Creatinine, Ser 1.29 0.40 - 1.50 mg/dL   Calcium 9.6 8.4 - 10.5 mg/dL   GFR 60.89 >60.00 mL/min  Lipid panel     Status: Abnormal   Collection Time: 05/11/18  8:27 AM  Result Value Ref Range   Cholesterol 187 0 - 200 mg/dL    Comment: ATP III Classification       Desirable:  < 200 mg/dL               Borderline High:  200 - 239 mg/dL          High:  > = 240 mg/dL   Triglycerides 90.0 0.0 - 149.0 mg/dL    Comment: Normal:  <150 mg/dLBorderline High:  150 - 199 mg/dL   HDL 57.50 >39.00 mg/dL   VLDL 18.0 0.0 - 40.0 mg/dL   LDL Cholesterol 111 (H) 0 - 99 mg/dL   Total CHOL/HDL Ratio 3     Comment:                Men          Women1/2 Average Risk     3.4          3.3Average Risk          5.0          4.42X Average Risk          9.6          7.13X Average Risk          15.0          11.0                       NonHDL 129.45     Comment: NOTE:  Non-HDL goal should be 30 mg/dL higher than patient's LDL goal (i.e. LDL goal of < 70 mg/dL, would have non-HDL goal of < 100 mg/dL)  Hepatic function panel  Status: Abnormal   Collection Time: 05/11/18  8:27 AM  Result Value Ref Range   Total Bilirubin 0.7 0.2 - 1.2 mg/dL   Bilirubin, Direct 0.1 0.0 - 0.3 mg/dL   Alkaline Phosphatase 148 (H) 39 - 117 U/L   AST 22 0 - 37 U/L   ALT 21 0 - 53 U/L   Total Protein 7.1 6.0 - 8.3 g/dL   Albumin 4.3 3.5 - 5.2 g/dL  CBC with Differential/Platelet     Status: Abnormal    Collection Time: 05/11/18  8:27 AM  Result Value Ref Range   WBC 5.9 4.0 - 10.5 K/uL   RBC 4.99 4.22 - 5.81 Mil/uL   Hemoglobin 16.9 13.0 - 17.0 g/dL   HCT 49.1 39.0 - 52.0 %   MCV 98.3 78.0 - 100.0 fl   MCHC 34.5 30.0 - 36.0 g/dL   RDW 13.3 11.5 - 15.5 %   Platelets 146.0 (L) 150.0 - 400.0 K/uL   Neutrophils Relative % 52.8 43.0 - 77.0 %   Lymphocytes Relative 26.9 12.0 - 46.0 %   Monocytes Relative 13.4 (H) 3.0 - 12.0 %   Eosinophils Relative 6.2 (H) 0.0 - 5.0 %   Basophils Relative 0.7 0.0 - 3.0 %   Neutro Abs 3.1 1.4 - 7.7 K/uL   Lymphs Abs 1.6 0.7 - 4.0 K/uL   Monocytes Absolute 0.8 0.1 - 1.0 K/uL   Eosinophils Absolute 0.4 0.0 - 0.7 K/uL   Basophils Absolute 0.0 0.0 - 0.1 K/uL  Uric acid     Status: Abnormal   Collection Time: 05/11/18  8:27 AM  Result Value Ref Range   Uric Acid, Serum 11.0 (H) 4.0 - 7.8 mg/dL  Hemoglobin A1c     Status: None   Collection Time: 05/11/18 12:30 PM  Result Value Ref Range   Hgb A1c MFr Bld 5.6 4.6 - 6.5 %    Comment: Glycemic Control Guidelines for People with Diabetes:Non Diabetic:  <6%Goal of Therapy: <7%Additional Action Suggested:  >8%    Assessment/Plan: 1. Essential hypertension BP improved. Asymptomatic. Discussed proper timing of home BP checks to ensure accuracy. Continue DASH diet. Continue amlodipine 10 mg daily. Follow-up 1 month with home BP measurements with PCP.  2. Idiopathic chronic gout of multiple sites without tophus Acute flare. He has resumed his colchicine. Directions given on how to take for this acute flare. Ice and elevation recommended. Cherry supplement recommended. Follow-up if not quickly resolving. Repeat uric acid level at follow-up with PCP. If recurrence are happening may need to reconsider Lasix.    Leeanne Rio, PA-C

## 2018-07-08 NOTE — Patient Instructions (Signed)
Please keep well-hydrated and get plenty of rest. Restart your colchicine for the gout flare up. Keep the extremity elevated and iced while resting.  Tart Cherry juicy can be beneficial for this.  Make sure to continue the allopurinol daily.   Follow-up if not resolving.   In regards to your BP level, this is improved on recheck. At home, when you are checking BP, make sure to have your medicine in your system for an hour before taking. Also, make sure to be sitting for 10 minutes before checking so we can get accurate measurements.  Follow-up with Dr. Birdie Riddle in 4-6 weeks to review your home BP logs.

## 2018-08-12 ENCOUNTER — Other Ambulatory Visit: Payer: Self-pay

## 2018-08-12 ENCOUNTER — Encounter: Payer: Self-pay | Admitting: Family Medicine

## 2018-08-12 ENCOUNTER — Ambulatory Visit: Payer: BLUE CROSS/BLUE SHIELD | Admitting: Family Medicine

## 2018-08-12 VITALS — BP 140/96 | HR 83 | Temp 97.9°F | Resp 16 | Ht 69.0 in | Wt 231.4 lb

## 2018-08-12 DIAGNOSIS — I10 Essential (primary) hypertension: Secondary | ICD-10-CM

## 2018-08-12 NOTE — Patient Instructions (Signed)
Schedule your complete physical for early December NO MED CHANGES AT THIS TIME Continue to work on healthy diet and regular exercise- you can do it! LOW SALT!!!  LOTS OF WATER! Call with any questions or concerns Happy Labor Day!!!

## 2018-08-12 NOTE — Assessment & Plan Note (Signed)
Chronic problem.  BP is again elevated today.  Asymptomatic.  Stressed need for lifestyle modifications to improve his readings.  Will follow closely.  Pt expressed understanding and is in agreement w/ plan.

## 2018-08-12 NOTE — Progress Notes (Signed)
   Subjective:    Patient ID: Keith Haynes, male    DOB: [birth year], 57 y.o.   MRN: [id number]  HPI HTN- chronic problem, on Telmisartan HCTZ 80/12.5mg  daily and Amlodipine 10mg .  BP is again elevated today.  No CP, SOB, HAs, visual changes, edema.  'working every day, feeling great'.  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  Last month BP was 130/82 which was the best it had been since CPE in November (122/78).  'i'm not eating right'.   Review of Systems For ROS see HPI     Objective:   Physical Exam  Constitutional: He is oriented to person, place, and time. He appears well-developed and well-nourished. No distress.  obese  HENT:  Head: Normocephalic and atraumatic.  Eyes: Pupils are equal, round, and reactive to light. Conjunctivae and EOM are normal.  Neck: Normal range of motion. Neck supple. No thyromegaly present.  Cardiovascular: Normal rate, regular rhythm, normal heart sounds and intact distal pulses.  No murmur heard. Pulmonary/Chest: Effort normal and breath sounds normal. No respiratory distress.  Abdominal: Soft. Bowel sounds are normal. He exhibits no distension.  Musculoskeletal: He exhibits no edema.  Lymphadenopathy:    He has no cervical adenopathy.  Neurological: He is alert and oriented to person, place, and time. No cranial nerve deficit.  Skin: Skin is warm and dry.  Psychiatric: He has a normal mood and affect. His behavior is normal.  Vitals reviewed.         Assessment & Plan:

## 2018-09-15 DIAGNOSIS — Z85820 Personal history of malignant melanoma of skin: Secondary | ICD-10-CM | POA: Diagnosis not present

## 2018-09-15 DIAGNOSIS — L813 Cafe au lait spots: Secondary | ICD-10-CM | POA: Diagnosis not present

## 2018-09-15 DIAGNOSIS — Z08 Encounter for follow-up examination after completed treatment for malignant neoplasm: Secondary | ICD-10-CM | POA: Diagnosis not present

## 2018-09-15 DIAGNOSIS — L570 Actinic keratosis: Secondary | ICD-10-CM | POA: Diagnosis not present

## 2018-09-15 DIAGNOSIS — L821 Other seborrheic keratosis: Secondary | ICD-10-CM | POA: Diagnosis not present

## 2018-10-07 ENCOUNTER — Other Ambulatory Visit: Payer: Self-pay | Admitting: Family Medicine

## 2018-10-26 ENCOUNTER — Other Ambulatory Visit: Payer: Self-pay | Admitting: Family Medicine

## 2018-12-11 ENCOUNTER — Telehealth: Payer: BLUE CROSS/BLUE SHIELD | Admitting: Family

## 2018-12-11 DIAGNOSIS — J069 Acute upper respiratory infection, unspecified: Secondary | ICD-10-CM

## 2018-12-11 MED ORDER — BENZONATATE 200 MG PO CAPS: 200.0000 mg | ORAL_CAPSULE | Freq: Two times a day (BID) | ORAL | 0 refills | Status: DC | PRN

## 2018-12-11 NOTE — Progress Notes (Signed)
We are sorry you are not feeling well.  Here is how we plan to help!  Based on what you have shared with me, it looks like you may have a viral upper respiratory infection or a "common cold".  Colds are caused by a large number of viruses; however, rhinovirus is the most common cause.   Symptoms of the common cold vary from person to person, with common symptoms including sore throat, cough, and malaise.  A low-grade fever of 100.4 may present, but is often uncommon.  Symptoms vary however, and are closely related to a person's age or underlying illnesses.  The most common symptoms associated with the common cold are nasal discharge or congestion, cough, sneezing, headache and pressure in the ears and face.  Cold symptoms usually persist for about 3 to 10 days, but can last up to 2 weeks.  It is important to know that colds do not cause serious illness or complications in most cases.    The common cold is transmitted from person to person, with the most common method of transmission being a person's hands.  The virus is able to live on the skin and can infect other persons for up to 2 hours after direct contact.  Also, colds are transmitted when someone coughs or sneezes; thus, it is important to cover the mouth to reduce this risk.  To keep the spread of the common cold at [place], good hand hygiene is very important.  This is an infection that is most likely caused by a virus. There are no specific treatments for the common cold other than to help you with the symptoms until the infection runs its course.    For nasal congestion, you may use an oral decongestants such as Mucinex D or if you have glaucoma or high blood pressure use plain Mucinex.  Saline nasal spray or nasal drops can help and can safely be used as often as needed for congestion.   If you do not have a history of heart disease, hypertension, diabetes or thyroid disease, prostate/bladder issues or glaucoma, you may also use Sudafed to treat  nasal congestion.  It is highly recommended that you consult with a pharmacist or your primary care physician to ensure this medication is safe for you to take.     If you have a cough, you may use cough suppressants such as Delsym and Robitussin.  If you have glaucoma or high blood pressure, you can also use Coricidin HBP.   For cough I have prescribed for you A prescription cough medication called Tessalon Perles 100 mg. You may take 1-2 capsules every 8 hours as needed for cough  If you have a sore or scratchy throat, use a saltwater gargle-  to  teaspoon of salt dissolved in a 4-ounce to 8-ounce glass of warm water.  Gargle the solution for approximately 15-30 seconds and then spit.  It is important not to swallow the solution.  You can also use throat lozenges/cough drops and Chloraseptic spray to help with throat pain or discomfort.  Warm or cold liquids can also be helpful in relieving throat pain.  For headache, pain or general discomfort, you can use Ibuprofen or Tylenol as directed.   Some authorities believe that zinc sprays or the use of Echinacea may shorten the course of your symptoms.   HOME CARE . Only take medications as instructed by your medical team. . Be sure to drink plenty of fluids. Water is fine as well as fruit  juices, sodas and electrolyte beverages. You may want to stay away from caffeine or alcohol. If you are nauseated, try taking small sips of liquids. How do you know if you are getting enough fluid? Your urine should be a pale yellow or almost colorless. . Get rest. . Taking a steamy shower or using a humidifier may help nasal congestion and ease sore throat pain. You can place a towel over your head and breathe in the steam from hot water coming from a faucet. . Using a saline nasal spray works much the same way. . Cough drops, hard candies and sore throat lozenges may ease your cough. . Avoid close contacts especially the very young and the elderly . Cover your  mouth if you cough or sneeze . Always remember to wash your hands.   GET HELP RIGHT AWAY IF: . You develop worsening fever. . If your symptoms do not improve within 10 days . You develop yellow or green discharge from your nose over 3 days. . You have coughing fits . You develop a severe head ache or visual changes. . You develop shortness of breath, difficulty breathing or start having chest pain . Your symptoms persist after you have completed your treatment plan  MAKE SURE YOU   Understand these instructions.  Will watch your condition.  Will get help right away if you are not doing well or get worse.  Your e-visit answers were reviewed by a board certified advanced clinical practitioner to complete your personal care plan. Depending upon the condition, your plan could have included both over the counter or prescription medications. Please review your pharmacy choice. If there is a problem, you may call our nursing hot line at and have the prescription routed to another pharmacy. Your safety is important to [place]. If you have drug allergies check your prescription carefully.   You can use MyChart to ask questions about today's visit, request a non-urgent call back, or ask for a work or school excuse for 24 hours related to this e-Visit. If it has been greater than 24 hours you will need to follow up with your provider, or enter a new e-Visit to address those concerns. You will get an e-mail in the next two days asking about your experience.  I hope that your e-visit has been valuable and will speed your recovery. Thank you for using e-visits.

## 2018-12-16 ENCOUNTER — Ambulatory Visit: Payer: Self-pay

## 2018-12-16 NOTE — Telephone Encounter (Signed)
Pt can take his colchicine as a 'flare' dose- 1.2mg  (2 tabs) now and then 1 tab 1 hr later.  He can continue his ibuprofen scheduled every 4-6 hrs and ice.  If no improvement, will need appt

## 2018-12-16 NOTE — Telephone Encounter (Signed)
Please advise 

## 2018-12-16 NOTE — Telephone Encounter (Signed)
Called pt and left a detailed message to advise of PCP recommendations.   [place] for [facility] to Discuss results / PCP recommendations / Schedule patient.

## 2018-12-16 NOTE — Telephone Encounter (Signed)
Returned called to patient who states he is having a gout flare up.  He states this started on Dec31. He says that his Rt foot and ankle are swollen to the point he can put his boot on briefly but not tie it.  He is in bed and states that it is extremely painful 8-10.  He has doubled up on his gout medications. He is taking 400mg  of Ibuprofen and following the directions on the bottle.  He states that he has stiffness to his right calf but says that is normal for a flare up because he walks funny. He is keeping the foot elevated as much as possible. Pt would like a call back from office. Per protocol pt should be seen today. Pt states he lives an hour away and feels he would not be able to get to the office. Appointment availability was checked but none available. Pt states he has a physical on the 8th and refused an appointment on Monday. Care advice read to patient to include go to urgent care. Pt was encouraged to see someone for his symptoms. Pt denies injury to the ankle or foot. Message will be routed to office as per pt request. Pt encouraged to use My Chart to message as well.  Reason for Disposition . [1] SEVERE pain (e.g., excruciating, unable to do any normal activities) AND [2] not improved after 2 hours of pain medicine  Answer Assessment - Initial Assessment Questions 1. ONSET: "When did the pain start?"      Dec 31 2. LOCATION: "Where is the pain located?"      Rt foot 3. PAIN: "How bad is the pain?"    (Scale 1-10; or mild, moderate, severe)   -  MILD (1-3): doesn't interfere with normal activities    -  MODERATE (4-7): interferes with normal activities (e.g., work or school) or awakens from sleep, limping    -  SEVERE (8-10): excruciating pain, unable to do any normal activities, unable to walk     8-10 4. WORK OR EXERCISE: "Has there been any recent work or exercise that involved this part of the body?"      No 5. CAUSE: "What do you think is causing the foot pain?"     gout 6.  OTHER SYMPTOMS: "Do you have any other symptoms?" (e.g., leg pain, rash, fever, numbness)     Calf is stiff 7. PREGNANCY: "Is there any chance you are pregnant?" "When was your last menstrual period?"     NA  Protocols used: FOOT PAIN-A-AH

## 2018-12-21 ENCOUNTER — Encounter: Payer: Self-pay | Admitting: Family Medicine

## 2018-12-21 ENCOUNTER — Other Ambulatory Visit: Payer: Self-pay

## 2018-12-21 ENCOUNTER — Ambulatory Visit (INDEPENDENT_AMBULATORY_CARE_PROVIDER_SITE_OTHER): Payer: BLUE CROSS/BLUE SHIELD | Admitting: Family Medicine

## 2018-12-21 VITALS — BP 131/88 | HR 87 | Temp 98.8°F | Resp 16 | Ht 69.0 in | Wt 238.4 lb

## 2018-12-21 DIAGNOSIS — Z125 Encounter for screening for malignant neoplasm of prostate: Secondary | ICD-10-CM | POA: Diagnosis not present

## 2018-12-21 DIAGNOSIS — I10 Essential (primary) hypertension: Secondary | ICD-10-CM

## 2018-12-21 DIAGNOSIS — Z0000 Encounter for general adult medical examination without abnormal findings: Secondary | ICD-10-CM

## 2018-12-21 LAB — LIPID PANEL
CHOL/HDL RATIO: 4
CHOLESTEROL: 166 mg/dL (ref 0–200)
HDL: 41.5 mg/dL (ref >39.00)
LDL Cholesterol: 94 mg/dL (ref 0–99)
NonHDL: 124.57
TRIGLYCERIDES: 152 mg/dL — AB (ref 0.0–149.0)
VLDL: 30.4 mg/dL (ref 0.0–40.0)

## 2018-12-21 LAB — HEPATIC FUNCTION PANEL
ALBUMIN: 4.2 g/dL (ref 3.5–5.2)
ALK PHOS: 142 U/L — AB (ref 39–117)
ALT: 51 U/L (ref 0–53)
AST: 38 U/L — AB (ref 0–37)
Bilirubin, Direct: 0.1 mg/dL (ref 0.0–0.3)
Total Bilirubin: 0.4 mg/dL (ref 0.2–1.2)
Total Protein: 7.1 g/dL (ref 6.0–8.3)

## 2018-12-21 LAB — CBC WITH DIFFERENTIAL/PLATELET
Basophils Absolute: 0.1 10*3/uL (ref 0.0–0.1)
Basophils Relative: 0.7 % (ref 0.0–3.0)
EOS PCT: 3.5 % (ref 0.0–5.0)
Eosinophils Absolute: 0.3 10*3/uL (ref 0.0–0.7)
HCT: 49.5 % (ref 39.0–52.0)
Hemoglobin: 17.1 g/dL — ABNORMAL HIGH (ref 13.0–17.0)
LYMPHS ABS: 1.7 10*3/uL (ref 0.7–4.0)
Lymphocytes Relative: 21.1 % (ref 12.0–46.0)
MCHC: 34.7 g/dL (ref 30.0–36.0)
MCV: 98.3 fl (ref 78.0–100.0)
MONO ABS: 0.8 10*3/uL (ref 0.1–1.0)
Monocytes Relative: 9.5 % (ref 3.0–12.0)
NEUTROS PCT: 65.2 % (ref 43.0–77.0)
Neutro Abs: 5.3 10*3/uL (ref 1.4–7.7)
PLATELETS: 238 10*3/uL (ref 150.0–400.0)
RBC: 5.03 Mil/uL (ref 4.22–5.81)
RDW: 13.1 % (ref 11.5–15.5)
WBC: 8.1 10*3/uL (ref 4.0–10.5)

## 2018-12-21 LAB — BASIC METABOLIC PANEL
BUN: 21 mg/dL (ref 6–23)
CHLORIDE: 101 meq/L (ref 96–112)
CO2: 27 mEq/L (ref 19–32)
Calcium: 9.5 mg/dL (ref 8.4–10.5)
Creatinine, Ser: 1.07 mg/dL (ref 0.40–1.50)
GFR: 75.39 mL/min (ref >60.00)
Glucose, Bld: 97 mg/dL (ref 70–99)
POTASSIUM: 3.7 meq/L (ref 3.5–5.1)
Sodium: 140 mEq/L (ref 135–145)

## 2018-12-21 LAB — TSH: TSH: 1.55 u[IU]/mL (ref 0.35–4.50)

## 2018-12-21 LAB — PSA: PSA: 3.47 ng/mL (ref 0.10–4.00)

## 2018-12-21 NOTE — Assessment & Plan Note (Signed)
Pt's PE WNL w/ exception of obesity.  UTD on colonoscopy, Tdap.  Declines flu.  Check labs.  Anticipatory guidance provided.

## 2018-12-21 NOTE — Patient Instructions (Signed)
Follow up in 6 months to recheck BP and weight loss progress We'll notify you of your lab results and make any changes if needed Continue to work on healthy diet and regular exercise- you can do it! Call with any questions or concerns Happy New Year!!

## 2018-12-21 NOTE — Assessment & Plan Note (Signed)
Chronic problem.  Pt's BMI is now 35.2.  Given that he has HTN and hyperlipidemia, he now classifies as morbidly obese.  Stressed need for healthy diet and regular exercise.  Check labs to risk stratify.  Will follow.

## 2018-12-21 NOTE — Progress Notes (Signed)
   Subjective:    Patient ID: Keith Haynes, male    DOB: [birth year], 58 y.o.   MRN: [id number]  HPI CPE- UTD on colonoscopy, Tdap.  Pt declines flu.   Review of Systems Patient reports no vision/hearing changes, anorexia, fever ,adenopathy, persistant/recurrent hoarseness, swallowing issues, chest pain, palpitations, edema, persistant/recurrent cough, hemoptysis, dyspnea (rest,exertional, paroxysmal nocturnal), gastrointestinal  bleeding (melena, rectal bleeding), abdominal pain, excessive heart burn, GU symptoms (dysuria, hematuria, voiding/incontinence issues) syncope, focal weakness, memory loss, numbness & tingling, skin/hair/nail changes, depression, anxiety, abnormal bruising/bleeding, musculoskeletal symptoms/signs.     Objective:   Physical Exam General Appearance:    Alert, cooperative, no distress, appears stated age  Head:    Normocephalic, without obvious abnormality, atraumatic  Eyes:    PERRL, conjunctiva/corneas clear, EOM's intact, fundi    benign, both eyes       Ears:    Normal TM's and external ear canals, both ears  Nose:   Nares normal, septum midline, mucosa normal, no drainage   or sinus tenderness  Throat:   Lips, mucosa, and tongue normal; teeth and gums normal  Neck:   Supple, symmetrical, trachea midline, no adenopathy;       thyroid:  No enlargement/tenderness/nodules  Back:     Symmetric, no curvature, ROM normal, no CVA tenderness  Lungs:     Clear to auscultation bilaterally, respirations unlabored  Chest wall:    No tenderness or deformity  Heart:    Regular rate and rhythm, S1 and S2 normal, no murmur, rub   or gallop  Abdomen:     Soft, non-tender, bowel sounds active all four quadrants,    no masses, no organomegaly  Genitalia:    Deferred at pt's request  Rectal:    Extremities:   Extremities normal, atraumatic, no cyanosis or edema  Pulses:   2+ and symmetric all extremities  Skin:   Skin color, texture, turgor normal, no rashes or lesions    Lymph nodes:   Cervical, supraclavicular, and axillary nodes normal  Neurologic:   CNII-XII intact. Normal strength, sensation and reflexes      throughout          Assessment & Plan:

## 2018-12-21 NOTE — Assessment & Plan Note (Signed)
Chronic problem.  Adequate control.  Asymptomatic.  Check labs.  No anticipated med changes.  Will follow. 

## 2018-12-22 ENCOUNTER — Encounter: Payer: Self-pay | Admitting: General Practice

## 2018-12-22 ENCOUNTER — Other Ambulatory Visit (INDEPENDENT_AMBULATORY_CARE_PROVIDER_SITE_OTHER): Payer: BLUE CROSS/BLUE SHIELD

## 2018-12-22 ENCOUNTER — Encounter: Payer: Self-pay | Admitting: Family Medicine

## 2018-12-22 DIAGNOSIS — M1009 Idiopathic gout, multiple sites: Secondary | ICD-10-CM | POA: Diagnosis not present

## 2018-12-22 LAB — URIC ACID: Uric Acid, Serum: 6.1 mg/dL (ref 4.0–7.8)

## 2018-12-23 ENCOUNTER — Encounter: Payer: Self-pay | Admitting: Family Medicine

## 2018-12-26 MED ORDER — PREDNISONE 10 MG PO TABS: ORAL_TABLET | ORAL | 0 refills | Status: DC

## 2019-01-28 ENCOUNTER — Other Ambulatory Visit: Payer: Self-pay | Admitting: Family Medicine

## 2019-01-28 DIAGNOSIS — I10 Essential (primary) hypertension: Secondary | ICD-10-CM

## 2019-01-29 ENCOUNTER — Other Ambulatory Visit: Payer: Self-pay | Admitting: Family Medicine

## 2019-02-03 ENCOUNTER — Other Ambulatory Visit: Payer: Self-pay | Admitting: Family Medicine

## 2019-02-08 ENCOUNTER — Other Ambulatory Visit: Payer: Self-pay | Admitting: Family Medicine

## 2019-02-17 ENCOUNTER — Encounter: Payer: Self-pay | Admitting: Family Medicine

## 2019-02-17 MED ORDER — PREDNISONE 10 MG PO TABS: ORAL_TABLET | ORAL | 0 refills | Status: DC

## 2019-04-03 ENCOUNTER — Other Ambulatory Visit: Payer: Self-pay | Admitting: Family Medicine

## 2019-04-29 ENCOUNTER — Other Ambulatory Visit: Payer: Self-pay | Admitting: Family Medicine

## 2019-06-15 ENCOUNTER — Other Ambulatory Visit: Payer: Self-pay | Admitting: Family Medicine

## 2019-06-15 ENCOUNTER — Encounter: Payer: Self-pay | Admitting: Family Medicine

## 2019-06-28 ENCOUNTER — Encounter: Payer: Self-pay | Admitting: Family Medicine

## 2019-06-28 ENCOUNTER — Ambulatory Visit (INDEPENDENT_AMBULATORY_CARE_PROVIDER_SITE_OTHER): Payer: BC Managed Care – PPO | Admitting: Family Medicine

## 2019-06-28 ENCOUNTER — Other Ambulatory Visit: Payer: Self-pay

## 2019-06-28 VITALS — BP 140/84 | Temp 96.6°F | Ht 70.0 in | Wt 245.0 lb

## 2019-06-28 DIAGNOSIS — I10 Essential (primary) hypertension: Secondary | ICD-10-CM

## 2019-06-28 DIAGNOSIS — E785 Hyperlipidemia, unspecified: Secondary | ICD-10-CM | POA: Diagnosis not present

## 2019-06-28 DIAGNOSIS — Z6835 Body mass index (BMI) 35.0-35.9, adult: Secondary | ICD-10-CM

## 2019-06-28 NOTE — Progress Notes (Signed)
I have discussed the procedure for the virtual visit with the patient who has given consent to proceed with assessment and treatment.   Elia Nunley L Abdullah Rizzi, CMA     

## 2019-06-28 NOTE — Progress Notes (Signed)
   Virtual Visit via Video   I connected with patient on 06/28/19 at  9:00 AM EDT by a video enabled telemedicine application and verified that I am speaking with the correct person using two identifiers.  Location patient: Home Location provider: [facility], Office Persons participating in the virtual visit: Patient, Provider, Payne (Jess B)  I discussed the limitations of evaluation and management by telemedicine and the availability of in person appointments. The patient expressed understanding and agreed to proceed.  Subjective:   HPI:   HTN- chronic problem, on Amlodipine 10mg , Lasix 20mg , Telmisartan HCTZ 80/12.5mg  daily.  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  No CP, SOB, HAs, visual changes, edema.  Obesity- pt has gained 7 lbs since last visit.  No regular exercise due to hectic work schedule.  Hyperlipidemia- chronic problem.  Thus far pt has been able to control w/ diet and exercise.  He has gained weight recently and due for repeat lipid panel.  No abd pain, N/V.  ROS:   See pertinent positives and negatives per HPI.  Patient Active Problem List   Diagnosis Date Noted  . Morbid obesity ([place]) 05/04/2017  . Hyperlipidemia 05/12/2016  . Right flank pain 12/27/2015  . Physical exam 10/29/2014  . ED (erectile dysfunction) 04/26/2014  . HTN (hypertension) 03/26/2014  . Gout 03/26/2014    Social History   Tobacco Use  . Smoking status: Never Smoker  . Smokeless tobacco: Never Used  Substance Use Topics  . Alcohol use: Yes    Current Outpatient Medications:  .  allopurinol (ZYLOPRIM) 300 MG tablet, TAKE 1 TABLET BY MOUTH EVERY DAY, Disp: 90 tablet, Rfl: 1 .  amLODipine (NORVASC) 10 MG tablet, TAKE 1 TABLET BY MOUTH EVERY DAY, Disp: 90 tablet, Rfl: 1 .  colchicine 0.6 MG tablet, TAKE 1 TABLET (0.6 MG TOTAL) BY MOUTH 2 (TWO) TIMES DAILY., Disp: 180 tablet, Rfl: 1 .  furosemide (LASIX) 20 MG tablet, TAKE 1 TABLET (20 MG TOTAL) BY MOUTH DAILY., Disp: 90  tablet, Rfl: 1 .  sildenafil (VIAGRA) 100 MG tablet, TAKE 1/2 TO 1 TABLET (50-100 MG TOTAL) BY MOUTH DAILY AS NEEDED FOR ERECTILE DYSFUNCTION., Disp: 10 tablet, Rfl: 5 .  telmisartan-hydrochlorothiazide (MICARDIS HCT) 80-12.5 MG tablet, TAKE 1 TABLET BY MOUTH EVERY DAY, Disp: 90 tablet, Rfl: 1  No Known Allergies  Objective:   BP 140/84   Temp (!) 96.6 F (35.9 C) (Oral)   Ht 5\' 10"  (1.778 m)   Wt 245 lb (111.1 kg)   BMI 35.15 kg/m   AAOx3, NAD NCAT, EOMI No obvious CN deficits Coloring WNL Pt is able to speak clearly, coherently without shortness of breath or increased work of breathing.  Thought process is linear.  Mood is appropriate.   Assessment and Plan:   HTN- chronic problem.  140 SBP is not ideal but is good for pt considering higher BPs in the past.  Currently asymptomatic.  Pt refusing labs at this time.  Will follow.  Obesity- stressed need for healthy diet and regular exercise as pt continues to gain weight.  Pt refusing labs at this time.  Will follow.  Hyperlipidemia- chronic problem, attempting to control w/ diet and exercise but admits to slacking on both.  Pt refusing labs at this time.  Will repeat at CPE.   Annye Asa, MD 06/28/2019

## 2019-06-29 DIAGNOSIS — L570 Actinic keratosis: Secondary | ICD-10-CM | POA: Diagnosis not present

## 2019-06-29 DIAGNOSIS — Z85820 Personal history of malignant melanoma of skin: Secondary | ICD-10-CM | POA: Diagnosis not present

## 2019-06-29 DIAGNOSIS — Z85828 Personal history of other malignant neoplasm of skin: Secondary | ICD-10-CM | POA: Diagnosis not present

## 2019-06-29 DIAGNOSIS — Z08 Encounter for follow-up examination after completed treatment for malignant neoplasm: Secondary | ICD-10-CM | POA: Diagnosis not present

## 2019-07-26 ENCOUNTER — Other Ambulatory Visit: Payer: Self-pay | Admitting: Family Medicine

## 2019-07-26 DIAGNOSIS — I10 Essential (primary) hypertension: Secondary | ICD-10-CM

## 2019-08-17 ENCOUNTER — Encounter: Payer: Self-pay | Admitting: *Deleted

## 2019-09-25 ENCOUNTER — Other Ambulatory Visit: Payer: Self-pay | Admitting: Family Medicine

## 2019-10-22 ENCOUNTER — Other Ambulatory Visit: Payer: Self-pay | Admitting: Family Medicine

## 2019-12-21 ENCOUNTER — Other Ambulatory Visit: Payer: Self-pay

## 2019-12-22 ENCOUNTER — Encounter: Payer: Self-pay | Admitting: Family Medicine

## 2019-12-22 ENCOUNTER — Ambulatory Visit (INDEPENDENT_AMBULATORY_CARE_PROVIDER_SITE_OTHER): Payer: BC Managed Care – PPO | Admitting: Family Medicine

## 2019-12-22 VITALS — BP 116/70 | HR 78 | Temp 98.2°F | Resp 16 | Ht 70.0 in | Wt 271.2 lb

## 2019-12-22 DIAGNOSIS — Z0000 Encounter for general adult medical examination without abnormal findings: Secondary | ICD-10-CM

## 2019-12-22 DIAGNOSIS — Z125 Encounter for screening for malignant neoplasm of prostate: Secondary | ICD-10-CM

## 2019-12-22 MED ORDER — TADALAFIL 20 MG PO TABS: 20.0000 mg | ORAL_TABLET | Freq: Every day | ORAL | 6 refills | Status: DC | PRN

## 2019-12-22 NOTE — Patient Instructions (Signed)
Follow up in 6 months to recheck BP and cholesterol We'll notify you of your lab results and make any changes if needed Continue to work on healthy diet and regular exercise- you can do it!! Call with any questions or concerns Happy New Year!! Stay Safe!  Stay Healthy!!

## 2019-12-22 NOTE — Assessment & Plan Note (Signed)
Deteriorated.  Pt has gained 25+ lbs since last visit.  Stressed need for healthy diet and regular exercise.  Check labs to risk stratify.  Will follow. 

## 2019-12-22 NOTE — Progress Notes (Signed)
   Subjective:    Patient ID: Keith Haynes, male    DOB: 1961/05/21, 59 y.o.   MRN: [id number]  HPI CPE- UTD on Tdap, colonoscopy.  Declined flu.  Pt has gained 25 lbs since July.   Review of Systems Patient reports no vision/hearing changes, anorexia, fever ,adenopathy, persistant/recurrent hoarseness, swallowing issues, chest pain, palpitations, edema, persistant/recurrent cough, hemoptysis, dyspnea (rest,exertional, paroxysmal nocturnal), gastrointestinal  bleeding (melena, rectal bleeding), abdominal pain, excessive heart burn, GU symptoms (dysuria, hematuria, voiding/incontinence issues) syncope, focal weakness, memory loss, numbness & tingling, skin/hair/nail changes, depression, anxiety, abnormal bruising/bleeding, musculoskeletal symptoms/signs.   This visit occurred during the SARS-CoV-2 public health emergency.  Safety protocols were in place, including screening questions prior to the visit, additional usage of staff PPE, and extensive cleaning of exam room while observing appropriate contact time as indicated for disinfecting solutions.       Objective:   Physical Exam General Appearance:    Alert, cooperative, no distress, appears stated age  Head:    Normocephalic, without obvious abnormality, atraumatic  Eyes:    PERRL, conjunctiva/corneas clear, EOM's intact, fundi    benign, both eyes       Ears:    Normal TM's and external ear canals, both ears  Nose:   Deferred due to COVID  Throat:   Neck:   Supple, symmetrical, trachea midline, no adenopathy;       thyroid:  No enlargement/tenderness/nodules  Back:     Symmetric, no curvature, ROM normal, no CVA tenderness  Lungs:     Clear to auscultation bilaterally, respirations unlabored  Chest wall:    No tenderness or deformity  Heart:    Regular rate and rhythm, S1 and S2 normal, no murmur, rub   or gallop  Abdomen:     Soft, non-tender, bowel sounds active all four quadrants,    no masses, no organomegaly  Genitalia:     Deferred at pt's request  Rectal:    Extremities:   Extremities normal, atraumatic, no cyanosis or edema  Pulses:   2+ and symmetric all extremities  Skin:   Skin color, texture, turgor normal, no rashes or lesions  Lymph nodes:   Cervical, supraclavicular, and axillary nodes normal  Neurologic:   CNII-XII intact. Normal strength, sensation and reflexes      throughout          Assessment & Plan:

## 2019-12-22 NOTE — Assessment & Plan Note (Signed)
Pt's PE WNL w/ exception of obesity.  UTD on Tdap, colonoscopy.  Declines flu.  Check labs.  Anticipatory guidance provided.

## 2019-12-23 LAB — CBC WITH DIFFERENTIAL/PLATELET
Absolute Monocytes: 768 cells/uL (ref 200–950)
Basophils Absolute: 68 cells/uL (ref 0–200)
Basophils Relative: 1 %
Eosinophils Absolute: 367 cells/uL (ref 15–500)
Eosinophils Relative: 5.4 %
HCT: 51.6 % — ABNORMAL HIGH (ref 38.5–50.0)
Hemoglobin: 17.9 g/dL — ABNORMAL HIGH (ref 13.2–17.1)
Lymphs Abs: 1856 cells/uL (ref 850–3900)
MCH: 34 pg — ABNORMAL HIGH (ref 27.0–33.0)
MCHC: 34.7 g/dL (ref 32.0–36.0)
MCV: 97.9 fL (ref 80.0–100.0)
MPV: 11.4 fL (ref 7.5–12.5)
Monocytes Relative: 11.3 %
Neutro Abs: 3740 cells/uL (ref 1500–7800)
Neutrophils Relative %: 55 %
Platelets: 150 10*3/uL (ref 140–400)
RBC: 5.27 10*6/uL (ref 4.20–5.80)
RDW: 12.8 % (ref 11.0–15.0)
Total Lymphocyte: 27.3 %
WBC: 6.8 10*3/uL (ref 3.8–10.8)

## 2019-12-23 LAB — HEPATIC FUNCTION PANEL
AG Ratio: 1.7 (calc) (ref 1.0–2.5)
ALT: 27 U/L (ref 9–46)
AST: 31 U/L (ref 10–35)
Albumin: 4.3 g/dL (ref 3.6–5.1)
Alkaline phosphatase (APISO): 145 U/L — ABNORMAL HIGH (ref 35–144)
Bilirubin, Direct: 0.1 mg/dL (ref 0.0–0.2)
Globulin: 2.5 g/dL (calc) (ref 1.9–3.7)
Indirect Bilirubin: 0.6 mg/dL (calc) (ref 0.2–1.2)
Total Bilirubin: 0.7 mg/dL (ref 0.2–1.2)
Total Protein: 6.8 g/dL (ref 6.1–8.1)

## 2019-12-23 LAB — PSA: PSA: 1.6 ng/mL (ref <=4.0)

## 2019-12-23 LAB — LIPID PANEL
Cholesterol: 185 mg/dL (ref <200)
HDL: 53 mg/dL (ref >=40)
LDL Cholesterol (Calc): 110 mg/dL (calc) — ABNORMAL HIGH
Non-HDL Cholesterol (Calc): 132 mg/dL (calc) — ABNORMAL HIGH (ref <130)
Total CHOL/HDL Ratio: 3.5 (calc) (ref <5.0)
Triglycerides: 116 mg/dL (ref <150)

## 2019-12-23 LAB — BASIC METABOLIC PANEL
BUN/Creatinine Ratio: 19 (calc) (ref 6–22)
BUN: 26 mg/dL — ABNORMAL HIGH (ref 7–25)
CO2: 25 mmol/L (ref 20–32)
Calcium: 9.8 mg/dL (ref 8.6–10.3)
Chloride: 102 mmol/L (ref 98–110)
Creat: 1.36 mg/dL — ABNORMAL HIGH (ref 0.70–1.33)
Glucose, Bld: 109 mg/dL — ABNORMAL HIGH (ref 65–99)
Potassium: 4.3 mmol/L (ref 3.5–5.3)
Sodium: 139 mmol/L (ref 135–146)

## 2019-12-23 LAB — TSH: TSH: 1.68 mIU/L (ref 0.40–4.50)

## 2019-12-26 ENCOUNTER — Other Ambulatory Visit: Payer: Self-pay | Admitting: Family Medicine

## 2019-12-26 DIAGNOSIS — R7989 Other specified abnormal findings of blood chemistry: Secondary | ICD-10-CM

## 2020-01-03 ENCOUNTER — Other Ambulatory Visit (INDEPENDENT_AMBULATORY_CARE_PROVIDER_SITE_OTHER): Payer: BC Managed Care – PPO

## 2020-01-03 DIAGNOSIS — R7989 Other specified abnormal findings of blood chemistry: Secondary | ICD-10-CM

## 2020-01-03 LAB — BASIC METABOLIC PANEL
BUN: 25 mg/dL — ABNORMAL HIGH (ref 6–23)
CO2: 29 mEq/L (ref 19–32)
Calcium: 8.9 mg/dL (ref 8.4–10.5)
Chloride: 103 mEq/L (ref 96–112)
Creatinine, Ser: 1.34 mg/dL (ref 0.40–1.50)
GFR: 54.52 mL/min — ABNORMAL LOW (ref >60.00)
Glucose, Bld: 114 mg/dL — ABNORMAL HIGH (ref 70–99)
Potassium: 3.9 mEq/L (ref 3.5–5.1)
Sodium: 139 mEq/L (ref 135–145)

## 2020-01-16 DIAGNOSIS — L570 Actinic keratosis: Secondary | ICD-10-CM | POA: Diagnosis not present

## 2020-01-16 DIAGNOSIS — D485 Neoplasm of uncertain behavior of skin: Secondary | ICD-10-CM | POA: Diagnosis not present

## 2020-01-23 ENCOUNTER — Other Ambulatory Visit: Payer: Self-pay | Admitting: Family Medicine

## 2020-01-23 DIAGNOSIS — C441221 Squamous cell carcinoma of skin of right upper eyelid, including canthus: Secondary | ICD-10-CM | POA: Diagnosis not present

## 2020-01-23 DIAGNOSIS — I10 Essential (primary) hypertension: Secondary | ICD-10-CM

## 2020-01-28 ENCOUNTER — Other Ambulatory Visit: Payer: Self-pay | Admitting: Family Medicine

## 2020-01-31 ENCOUNTER — Encounter: Payer: Self-pay | Admitting: Family Medicine

## 2020-01-31 DIAGNOSIS — N529 Male erectile dysfunction, unspecified: Secondary | ICD-10-CM

## 2020-02-01 MED ORDER — SILDENAFIL CITRATE 100 MG PO TABS: ORAL_TABLET | ORAL | 5 refills | Status: DC

## 2020-02-11 ENCOUNTER — Other Ambulatory Visit: Payer: Self-pay | Admitting: Family Medicine

## 2020-06-14 ENCOUNTER — Other Ambulatory Visit: Payer: Self-pay

## 2020-06-14 ENCOUNTER — Other Ambulatory Visit (INDEPENDENT_AMBULATORY_CARE_PROVIDER_SITE_OTHER): Payer: BC Managed Care – PPO

## 2020-06-14 ENCOUNTER — Other Ambulatory Visit: Payer: Self-pay | Admitting: General Practice

## 2020-06-14 ENCOUNTER — Ambulatory Visit: Payer: BC Managed Care – PPO | Admitting: Family Medicine

## 2020-06-14 ENCOUNTER — Encounter: Payer: Self-pay | Admitting: Family Medicine

## 2020-06-14 VITALS — BP 121/73 | HR 82 | Temp 97.9°F | Resp 16 | Ht 70.0 in | Wt 272.0 lb

## 2020-06-14 DIAGNOSIS — R748 Abnormal levels of other serum enzymes: Secondary | ICD-10-CM

## 2020-06-14 DIAGNOSIS — R799 Abnormal finding of blood chemistry, unspecified: Secondary | ICD-10-CM

## 2020-06-14 DIAGNOSIS — I10 Essential (primary) hypertension: Secondary | ICD-10-CM | POA: Diagnosis not present

## 2020-06-14 DIAGNOSIS — E785 Hyperlipidemia, unspecified: Secondary | ICD-10-CM

## 2020-06-14 LAB — BASIC METABOLIC PANEL
BUN: 26 mg/dL — ABNORMAL HIGH (ref 6–23)
CO2: 31 mEq/L (ref 19–32)
Calcium: 9.6 mg/dL (ref 8.4–10.5)
Chloride: 99 mEq/L (ref 96–112)
Creatinine, Ser: 1.41 mg/dL (ref 0.40–1.50)
GFR: 51.33 mL/min — ABNORMAL LOW (ref >60.00)
Glucose, Bld: 110 mg/dL — ABNORMAL HIGH (ref 70–99)
Potassium: 4.5 mEq/L (ref 3.5–5.1)
Sodium: 139 mEq/L (ref 135–145)

## 2020-06-14 LAB — CBC WITH DIFFERENTIAL/PLATELET
Basophils Absolute: 0.1 10*3/uL (ref 0.0–0.1)
Basophils Relative: 0.8 % (ref 0.0–3.0)
Eosinophils Absolute: 0.5 10*3/uL (ref 0.0–0.7)
Eosinophils Relative: 6.9 % — ABNORMAL HIGH (ref 0.0–5.0)
HCT: 50.4 % (ref 39.0–52.0)
Hemoglobin: 17.2 g/dL — ABNORMAL HIGH (ref 13.0–17.0)
Lymphocytes Relative: 20.3 % (ref 12.0–46.0)
Lymphs Abs: 1.5 10*3/uL (ref 0.7–4.0)
MCHC: 34.2 g/dL (ref 30.0–36.0)
MCV: 100.6 fl — ABNORMAL HIGH (ref 78.0–100.0)
Monocytes Absolute: 0.9 10*3/uL (ref 0.1–1.0)
Monocytes Relative: 12.2 % — ABNORMAL HIGH (ref 3.0–12.0)
Neutro Abs: 4.5 10*3/uL (ref 1.4–7.7)
Neutrophils Relative %: 59.8 % (ref 43.0–77.0)
Platelets: 155 10*3/uL (ref 150.0–400.0)
RBC: 5.01 Mil/uL (ref 4.22–5.81)
RDW: 14.2 % (ref 11.5–15.5)
WBC: 7.5 10*3/uL (ref 4.0–10.5)

## 2020-06-14 LAB — LIPID PANEL
Cholesterol: 195 mg/dL (ref 0–200)
HDL: 55.4 mg/dL (ref >39.00)
LDL Cholesterol: 114 mg/dL — ABNORMAL HIGH (ref 0–99)
NonHDL: 139.71
Total CHOL/HDL Ratio: 4
Triglycerides: 127 mg/dL (ref 0.0–149.0)
VLDL: 25.4 mg/dL (ref 0.0–40.0)

## 2020-06-14 LAB — HEPATIC FUNCTION PANEL
ALT: 23 U/L (ref 0–53)
AST: 24 U/L (ref 0–37)
Albumin: 4.4 g/dL (ref 3.5–5.2)
Alkaline Phosphatase: 149 U/L — ABNORMAL HIGH (ref 39–117)
Bilirubin, Direct: 0.2 mg/dL (ref 0.0–0.3)
Total Bilirubin: 0.8 mg/dL (ref 0.2–1.2)
Total Protein: 7 g/dL (ref 6.0–8.3)

## 2020-06-14 LAB — TSH: TSH: 2.05 u[IU]/mL (ref 0.35–4.50)

## 2020-06-14 LAB — MAGNESIUM: Magnesium: 2.2 mg/dL (ref 1.5–2.5)

## 2020-06-14 LAB — GAMMA GT: GGT: 38 U/L (ref 7–51)

## 2020-06-14 NOTE — Progress Notes (Signed)
   Subjective:    Patient ID: Keith Haynes, male    DOB: Feb 01, 1961, 59 y.o.   MRN: [id number]  HPI HTN- chronic problem, on Amlodipine 10mg  daily, Telmisartan HCTZ 80/12.5mg  daily, Lasix 20mg  daily w/ good control.  + leg cramps- particularly in the morning.  Pt reports good water intake.  No CP, SOB, HAs, visual changes, edema.  Hyperlipidemia- has been able to control w/ diet and exercise.  Not currently on medication.  Denies abd pain, N/V  Obesity- BMI 39.03.  No regular exercise.  Not following particular diet.   Review of Systems For ROS see HPI   This visit occurred during the SARS-CoV-2 public health emergency.  Safety protocols were in place, including screening questions prior to the visit, additional usage of staff PPE, and extensive cleaning of exam room while observing appropriate contact time as indicated for disinfecting solutions.       Objective:   Physical Exam Vitals reviewed.  Constitutional:      General: He is not in acute distress.    Appearance: He is well-developed. He is obese.  HENT:     Head: Normocephalic and atraumatic.  Eyes:     Conjunctiva/sclera: Conjunctivae normal.     Pupils: Pupils are equal, round, and reactive to light.  Neck:     Thyroid: No thyromegaly.  Cardiovascular:     Rate and Rhythm: Normal rate and regular rhythm.     Heart sounds: Normal heart sounds. No murmur heard.   Pulmonary:     Effort: Pulmonary effort is normal. No respiratory distress.     Breath sounds: Normal breath sounds.  Abdominal:     General: Bowel sounds are normal. There is no distension.     Palpations: Abdomen is soft.  Musculoskeletal:     Cervical back: Normal range of motion and neck supple.  Lymphadenopathy:     Cervical: No cervical adenopathy.  Skin:    General: Skin is warm and dry.  Neurological:     Mental Status: He is alert and oriented to person, place, and time.     Cranial Nerves: No cranial nerve deficit.  Psychiatric:         Behavior: Behavior normal.           Assessment & Plan:

## 2020-06-14 NOTE — Assessment & Plan Note (Signed)
Chronic problem.  Attempting to control w/ diet and exercise but admits to not doing well on either.  Check labs and start meds prn.

## 2020-06-14 NOTE — Patient Instructions (Signed)
Schedule your complete physical in 6 months We'll notify you of your lab results and make any changes if needed Continue to work on healthy diet and regular exercise- you can do it! Call with any questions or concerns Have a great summer!!! 

## 2020-06-14 NOTE — Assessment & Plan Note (Signed)
Chronic problem.  Well controlled.  Currently asymptomatic w/ exception of leg cramps.  Check labs.  No anticipated med changes.  Will follow.

## 2020-06-14 NOTE — Assessment & Plan Note (Signed)
Chronic problem.  Weight is stable.  Stressed need for healthy diet and regular exercise.  Will follow.

## 2020-06-18 ENCOUNTER — Other Ambulatory Visit (INDEPENDENT_AMBULATORY_CARE_PROVIDER_SITE_OTHER): Payer: BC Managed Care – PPO

## 2020-06-18 DIAGNOSIS — R748 Abnormal levels of other serum enzymes: Secondary | ICD-10-CM

## 2020-06-18 LAB — VITAMIN D 25 HYDROXY (VIT D DEFICIENCY, FRACTURES): VITD: 20.52 ng/mL — ABNORMAL LOW (ref 30.00–100.00)

## 2020-06-19 ENCOUNTER — Other Ambulatory Visit: Payer: Self-pay

## 2020-06-19 DIAGNOSIS — R799 Abnormal finding of blood chemistry, unspecified: Secondary | ICD-10-CM

## 2020-06-19 DIAGNOSIS — R7989 Other specified abnormal findings of blood chemistry: Secondary | ICD-10-CM

## 2020-06-19 MED ORDER — VITAMIN D (ERGOCALCIFEROL) 1.25 MG (50000 UNIT) PO CAPS
50000.0000 [IU] | ORAL_CAPSULE | ORAL | 2 refills | Status: DC
Start: 2020-06-19 — End: 2021-06-10

## 2020-06-21 ENCOUNTER — Encounter: Payer: Self-pay | Admitting: Family Medicine

## 2020-06-21 ENCOUNTER — Other Ambulatory Visit: Payer: Self-pay

## 2020-06-21 ENCOUNTER — Ambulatory Visit (INDEPENDENT_AMBULATORY_CARE_PROVIDER_SITE_OTHER): Payer: BC Managed Care – PPO

## 2020-06-21 DIAGNOSIS — R799 Abnormal finding of blood chemistry, unspecified: Secondary | ICD-10-CM

## 2020-06-21 LAB — BASIC METABOLIC PANEL
BUN: 23 mg/dL (ref 6–23)
CO2: 28 mEq/L (ref 19–32)
Calcium: 8.8 mg/dL (ref 8.4–10.5)
Chloride: 104 mEq/L (ref 96–112)
Creatinine, Ser: 1.19 mg/dL (ref 0.40–1.50)
GFR: 62.42 mL/min (ref >60.00)
Glucose, Bld: 133 mg/dL — ABNORMAL HIGH (ref 70–99)
Potassium: 4 mEq/L (ref 3.5–5.1)
Sodium: 140 mEq/L (ref 135–145)

## 2020-06-24 ENCOUNTER — Encounter: Payer: Self-pay | Admitting: Family Medicine

## 2020-06-24 MED ORDER — PREDNISONE 10 MG PO TABS
ORAL_TABLET | ORAL | 0 refills | Status: DC
Start: 2020-06-24 — End: 2022-05-08

## 2020-07-23 DIAGNOSIS — L570 Actinic keratosis: Secondary | ICD-10-CM | POA: Diagnosis not present

## 2020-07-23 DIAGNOSIS — D485 Neoplasm of uncertain behavior of skin: Secondary | ICD-10-CM | POA: Diagnosis not present

## 2020-08-14 ENCOUNTER — Other Ambulatory Visit: Payer: Self-pay | Admitting: Family Medicine

## 2020-09-20 ENCOUNTER — Other Ambulatory Visit: Payer: Self-pay

## 2020-09-20 ENCOUNTER — Ambulatory Visit (INDEPENDENT_AMBULATORY_CARE_PROVIDER_SITE_OTHER): Payer: BC Managed Care – PPO

## 2020-09-20 DIAGNOSIS — R7989 Other specified abnormal findings of blood chemistry: Secondary | ICD-10-CM

## 2020-09-20 DIAGNOSIS — E559 Vitamin D deficiency, unspecified: Secondary | ICD-10-CM

## 2020-09-20 DIAGNOSIS — R799 Abnormal finding of blood chemistry, unspecified: Secondary | ICD-10-CM | POA: Diagnosis not present

## 2020-09-20 LAB — VITAMIN D 25 HYDROXY (VIT D DEFICIENCY, FRACTURES): VITD: 33.41 ng/mL (ref 30.00–100.00)

## 2020-09-20 LAB — HEPATIC FUNCTION PANEL
ALT: 28 U/L (ref 0–53)
AST: 27 U/L (ref 0–37)
Albumin: 4.2 g/dL (ref 3.5–5.2)
Alkaline Phosphatase: 150 U/L — ABNORMAL HIGH (ref 39–117)
Bilirubin, Direct: 0.1 mg/dL (ref 0.0–0.3)
Total Bilirubin: 0.7 mg/dL (ref 0.2–1.2)
Total Protein: 7 g/dL (ref 6.0–8.3)

## 2020-09-22 ENCOUNTER — Other Ambulatory Visit: Payer: Self-pay | Admitting: Family Medicine

## 2020-10-08 ENCOUNTER — Telehealth: Payer: Self-pay | Admitting: Family Medicine

## 2020-10-08 NOTE — Telephone Encounter (Signed)
Patient called and needs to speak with Dr. Birdie Riddle or nurse - concerning ragweed

## 2020-10-08 NOTE — Telephone Encounter (Signed)
Agree w/ advice given 

## 2020-10-08 NOTE — Telephone Encounter (Signed)
Called and spoke with pt. He has been working outside. Michela Pitcher he thinks that the ragweed has caused an infection pt has tried Research officer, political party but no eye drops. Pt was advised that he would need an appointment for evaluation, as ragweed doesn't normally cause an infection. He stated he was going out of town and could not come in. Pt was told he could try OTC allergy eye drops to see if it clears up if not he must have an evaluation. Pt stated an understanding.

## 2020-12-09 ENCOUNTER — Telehealth (INDEPENDENT_AMBULATORY_CARE_PROVIDER_SITE_OTHER): Payer: BC Managed Care – PPO | Admitting: Family Medicine

## 2020-12-09 ENCOUNTER — Other Ambulatory Visit: Payer: Self-pay

## 2020-12-09 ENCOUNTER — Encounter: Payer: Self-pay | Admitting: Family Medicine

## 2020-12-09 DIAGNOSIS — Z2821 Immunization not carried out because of patient refusal: Secondary | ICD-10-CM | POA: Diagnosis not present

## 2020-12-09 DIAGNOSIS — U071 COVID-19: Secondary | ICD-10-CM

## 2020-12-09 NOTE — Progress Notes (Signed)
Virtual Visit via Video   I connected with patient on 12/09/20 at  2:30 PM EST by a video enabled telemedicine application and verified that I am speaking with the correct person using two identifiers.  Location patient: Home Location provider: [facility] Persons participating in the virtual visit: Patient, Provider, [place] (Sabrina M)  I discussed the limitations of evaluation and management by telemedicine and the availability of in person appointments. The patient expressed understanding and agreed to proceed.  Subjective:   HPI:   COVID- pt developed sxs ~9 days ago and tested + 6 days ago.  Denies SOB unless he is coughing.  Has to sleep upright.  + fever- 101.  No appetite, no energy.  Denies body aches.  Reports he is taking a daily Vit D supplement.  Mucinex as needed.  Denies vomiting or diarrhea.  ROS:   See pertinent positives and negatives per HPI.  Patient Active Problem List   Diagnosis Date Noted   Morbid obesity ([place]) 05/04/2017   Hyperlipidemia 05/12/2016   Right flank pain 12/27/2015   Physical exam 10/29/2014   ED (erectile dysfunction) 04/26/2014   HTN (hypertension) 03/26/2014   Gout 03/26/2014    Social History   Tobacco Use   Smoking status: Never Smoker   Smokeless tobacco: Never Used  Substance Use Topics   Alcohol use: Yes    Current Outpatient Medications:    allopurinol (ZYLOPRIM) 300 MG tablet, TAKE 1 TABLET BY MOUTH EVERY DAY, Disp: 90 tablet, Rfl: 1   amLODipine (NORVASC) 10 MG tablet, TAKE 1 TABLET BY MOUTH EVERY DAY, Disp: 90 tablet, Rfl: 1   colchicine 0.6 MG tablet, TAKE 1 TABLET (0.6 MG TOTAL) BY MOUTH 2 (TWO) TIMES DAILY., Disp: 180 tablet, Rfl: 1   predniSONE (DELTASONE) 10 MG tablet, 3 tabs x3 days and then 2 tabs x3 days and then 1 tab x3 days.  Take w/ food., Disp: 18 tablet, Rfl: 0   sildenafil (VIAGRA) 100 MG tablet, TAKE 1/2 TO 1 TABLET (50-100 MG TOTAL) BY MOUTH DAILY AS NEEDED FOR ERECTILE  DYSFUNCTION., Disp: 10 tablet, Rfl: 5   tadalafil (CIALIS) 20 MG tablet, Take 1 tablet (20 mg total) by mouth daily as needed for erectile dysfunction., Disp: 10 tablet, Rfl: 6   telmisartan-hydrochlorothiazide (MICARDIS HCT) 80-12.5 MG tablet, TAKE 1 TABLET BY MOUTH EVERY DAY, Disp: 90 tablet, Rfl: 1   furosemide (LASIX) 20 MG tablet, TAKE 1 TABLET BY MOUTH EVERY DAY (Patient not taking: Reported on 12/09/2020), Disp: 90 tablet, Rfl: 1   Vitamin D, Ergocalciferol, (DRISDOL) 1.25 MG (50000 UNIT) CAPS capsule, Take 1 capsule (50,000 Units total) by mouth every 7 (seven) days. (Patient not taking: Reported on 12/09/2020), Disp: 4 capsule, Rfl: 2  No Known Allergies  Objective:   There were no vitals taken for this visit. AAOx3, NAD NCAT, EOMI No obvious CN deficits Pt is able to speak clearly, coherently without shortness of breath or increased work of breathing.  Thought process is linear.  Mood is appropriate.   Assessment and Plan:   COVID- new.  Pt is unvaccinated.  Entire family is sick at this time.  He is at high risk given his morbid obesity, HTN.  Denies SOB unless coughing.  Decreased appetite but reports he is still drinking.  Cautioned him on dehydration risk.  Sent message to antibody infusion clinic although pt may be outside the window since he tested + 6 days ago and sxs started 9 days ago.  Warned him  this may be the case.  Reviewed supportive care (Vit D, Vit C, Zinc) and red flags that should prompt return.  Pt expressed understanding and is in agreement w/ plan.    Annye Asa, MD 12/09/2020

## 2020-12-09 NOTE — Progress Notes (Signed)
I connected with  Keith Haynes on 12/09/20 by a video enabled telemedicine application and verified that I am speaking with the correct person using two identifiers.   I discussed the limitations of evaluation and management by telemedicine. The patient expressed understanding and agreed to proceed.

## 2020-12-20 ENCOUNTER — Other Ambulatory Visit: Payer: Self-pay | Admitting: Family Medicine

## 2020-12-27 ENCOUNTER — Encounter: Payer: BC Managed Care – PPO | Admitting: Family Medicine

## 2021-01-10 ENCOUNTER — Encounter: Payer: Self-pay | Admitting: Family Medicine

## 2021-01-10 ENCOUNTER — Other Ambulatory Visit: Payer: Self-pay

## 2021-01-10 ENCOUNTER — Ambulatory Visit (INDEPENDENT_AMBULATORY_CARE_PROVIDER_SITE_OTHER): Payer: BC Managed Care – PPO | Admitting: Family Medicine

## 2021-01-10 VITALS — BP 138/85 | HR 91 | Temp 98.2°F | Resp 17 | Ht 70.0 in | Wt 262.4 lb

## 2021-01-10 DIAGNOSIS — Z0000 Encounter for general adult medical examination without abnormal findings: Secondary | ICD-10-CM

## 2021-01-10 DIAGNOSIS — Z125 Encounter for screening for malignant neoplasm of prostate: Secondary | ICD-10-CM | POA: Diagnosis not present

## 2021-01-10 LAB — BASIC METABOLIC PANEL
BUN: 22 mg/dL (ref 6–23)
CO2: 30 mEq/L (ref 19–32)
Calcium: 9.9 mg/dL (ref 8.4–10.5)
Chloride: 102 mEq/L (ref 96–112)
Creatinine, Ser: 1.22 mg/dL (ref 0.40–1.50)
GFR: 64.55 mL/min (ref >60.00)
Glucose, Bld: 105 mg/dL — ABNORMAL HIGH (ref 70–99)
Potassium: 3.9 mEq/L (ref 3.5–5.1)
Sodium: 139 mEq/L (ref 135–145)

## 2021-01-10 LAB — HEPATIC FUNCTION PANEL
ALT: 28 U/L (ref 0–53)
AST: 27 U/L (ref 0–37)
Albumin: 4.3 g/dL (ref 3.5–5.2)
Alkaline Phosphatase: 146 U/L — ABNORMAL HIGH (ref 39–117)
Bilirubin, Direct: 0.1 mg/dL (ref 0.0–0.3)
Total Bilirubin: 0.8 mg/dL (ref 0.2–1.2)
Total Protein: 7.4 g/dL (ref 6.0–8.3)

## 2021-01-10 LAB — LIPID PANEL
Cholesterol: 172 mg/dL (ref 0–200)
HDL: 45.3 mg/dL (ref >39.00)
LDL Cholesterol: 106 mg/dL — ABNORMAL HIGH (ref 0–99)
NonHDL: 127.1
Total CHOL/HDL Ratio: 4
Triglycerides: 104 mg/dL (ref 0.0–149.0)
VLDL: 20.8 mg/dL (ref 0.0–40.0)

## 2021-01-10 LAB — CBC WITH DIFFERENTIAL/PLATELET
Basophils Absolute: 0.1 10*3/uL (ref 0.0–0.1)
Basophils Relative: 0.8 % (ref 0.0–3.0)
Eosinophils Absolute: 0.5 10*3/uL (ref 0.0–0.7)
Eosinophils Relative: 6.4 % — ABNORMAL HIGH (ref 0.0–5.0)
HCT: 48.7 % (ref 39.0–52.0)
Hemoglobin: 16.7 g/dL (ref 13.0–17.0)
Lymphocytes Relative: 21.3 % (ref 12.0–46.0)
Lymphs Abs: 1.6 10*3/uL (ref 0.7–4.0)
MCHC: 34.2 g/dL (ref 30.0–36.0)
MCV: 99.2 fl (ref 78.0–100.0)
Monocytes Absolute: 1 10*3/uL (ref 0.1–1.0)
Monocytes Relative: 12.8 % — ABNORMAL HIGH (ref 3.0–12.0)
Neutro Abs: 4.5 10*3/uL (ref 1.4–7.7)
Neutrophils Relative %: 58.7 % (ref 43.0–77.0)
Platelets: 167 10*3/uL (ref 150.0–400.0)
RBC: 4.91 Mil/uL (ref 4.22–5.81)
RDW: 14.8 % (ref 11.5–15.5)
WBC: 7.7 10*3/uL (ref 4.0–10.5)

## 2021-01-10 LAB — TSH: TSH: 2.37 u[IU]/mL (ref 0.35–4.50)

## 2021-01-10 LAB — PSA: PSA: 4.16 ng/mL — ABNORMAL HIGH (ref 0.10–4.00)

## 2021-01-10 NOTE — Progress Notes (Signed)
   Subjective:    Patient ID: Keith Haynes, male    DOB: [birth year], 60 y.o.   MRN: [id number]  HPI CPE- UTD on colonoscopy (due this year).  UTD on Tdap.  Declines flu and COVID vaccines.  + 10 lb weight loss w/ COVID  Reviewed past medical, surgical, family and social histories.   Patient Care Team    Relationship Specialty Notifications Start End  Midge Minium, MD PCP - General Family Medicine  03/26/14   Lavonna Monarch, MD Consulting Physician Dermatology  11/04/15     Health Maintenance  Topic Date Due  . COVID-19 Vaccine (1) 01/26/2021 (Originally 10/08/1966)  . INFLUENZA VACCINE  03/13/2021 (Originally 07/14/2020)  . Hepatitis C Screening  06/14/2021 (Originally 08/30/1961)  . HIV Screening  06/14/2021 (Originally 10/09/1975)  . COLONOSCOPY (Pts 45-86yrs Insurance coverage will need to be confirmed)  03/26/2021  . TETANUS/TDAP  01/14/2023      Review of Systems Patient reports no vision/hearing changes, anorexia, fever ,adenopathy, persistant/recurrent hoarseness, swallowing issues, chest pain, palpitations, edema, persistant/recurrent cough, hemoptysis, dyspnea (rest,exertional, paroxysmal nocturnal), gastrointestinal  bleeding (melena, rectal bleeding), abdominal pain, excessive heart burn, GU symptoms (dysuria, hematuria, voiding/incontinence issues) syncope, focal weakness, memory loss, numbness & tingling, skin/hair/nail changes, depression, anxiety, abnormal bruising/bleeding, musculoskeletal symptoms/signs.   This visit occurred during the SARS-CoV-2 public health emergency.  Safety protocols were in place, including screening questions prior to the visit, additional usage of staff PPE, and extensive cleaning of exam room while observing appropriate contact time as indicated for disinfecting solutions.       Objective:   Physical Exam General Appearance:    Alert, cooperative, no distress, appears stated age  Head:    Normocephalic, without obvious abnormality,  atraumatic  Eyes:    PERRL, conjunctiva/corneas clear, EOM's intact, fundi    benign, both eyes       Ears:    Normal TM's and external ear canals, both ears  Nose:   Nares normal, septum midline, mucosa normal, no drainage   or sinus tenderness  Throat:   Lips, mucosa, and tongue normal; teeth and gums normal  Neck:   Supple, symmetrical, trachea midline, no adenopathy;       thyroid:  No enlargement/tenderness/nodules  Back:     Symmetric, no curvature, ROM normal, no CVA tenderness  Lungs:     Clear to auscultation bilaterally, respirations unlabored  Chest wall:    No tenderness or deformity  Heart:    Regular rate and rhythm, S1 and S2 normal, no murmur, rub   or gallop  Abdomen:     Soft, non-tender, bowel sounds active all four quadrants,    no masses, no organomegaly  Genitalia:    Normal male without lesion, masses,discharge or tenderness  Rectal:    Deferred due to young age  Extremities:   Extremities normal, atraumatic, no cyanosis or edema  Pulses:   2+ and symmetric all extremities  Skin:   Skin color, texture, turgor normal, no rashes or lesions  Lymph nodes:   Cervical, supraclavicular, and axillary nodes normal  Neurologic:   CNII-XII intact. Normal strength, sensation and reflexes      throughout          Assessment & Plan:

## 2021-01-10 NOTE — Patient Instructions (Signed)
Follow up in 6 months to recheck BP and cholesterol We'll notify you of your lab results and make any changes if needed Continue to work on healthy diet and regular exercise- you can do it! You will be due for a colonoscopy later this year- just keep that in the back of your mind Call with any questions or concerns [place]!!

## 2021-01-10 NOTE — Assessment & Plan Note (Signed)
Pt has lost 10 lbs since last visit.  BMI is now 37.65.  Since he has both HTN and hyperlipidemia this qualifies as morbid obesity.  Encouraged him to continue working on healthy diet and regular exercise.  Check labs to risk stratify.  Will follow.

## 2021-01-10 NOTE — Assessment & Plan Note (Signed)
Pt's PE WNL w/ exception of obesity.  UTD on Tdap.  Refusing flu and COVID.  Due for colonoscopy later this year but he wants to hold off.  Check labs.  Anticipatory guidance provided.

## 2021-01-17 ENCOUNTER — Telehealth: Payer: Self-pay

## 2021-01-17 ENCOUNTER — Other Ambulatory Visit: Payer: Self-pay

## 2021-01-17 ENCOUNTER — Telehealth: Payer: Self-pay | Admitting: Family Medicine

## 2021-01-17 DIAGNOSIS — R972 Elevated prostate specific antigen [PSA]: Secondary | ICD-10-CM

## 2021-01-17 NOTE — Telephone Encounter (Signed)
Patient called returned regarding a referral. Referral was never placed after receiving lab results on 01/10/21. The referral was just placed today for urology. Patient has been notified.

## 2021-01-17 NOTE — Telephone Encounter (Signed)
Patient states that he was told he was going to be referred to a urologist but he hasnt heard anything yet, please advise

## 2021-01-17 NOTE — Telephone Encounter (Signed)
Referral has been placed and pt notified.

## 2021-01-23 ENCOUNTER — Other Ambulatory Visit: Payer: Self-pay

## 2021-01-23 ENCOUNTER — Encounter: Payer: Self-pay | Admitting: Family Medicine

## 2021-01-23 DIAGNOSIS — R972 Elevated prostate specific antigen [PSA]: Secondary | ICD-10-CM

## 2021-01-28 DIAGNOSIS — L570 Actinic keratosis: Secondary | ICD-10-CM | POA: Diagnosis not present

## 2021-03-05 DIAGNOSIS — R972 Elevated prostate specific antigen [PSA]: Secondary | ICD-10-CM | POA: Diagnosis not present

## 2021-03-05 DIAGNOSIS — N401 Enlarged prostate with lower urinary tract symptoms: Secondary | ICD-10-CM | POA: Diagnosis not present

## 2021-03-22 ENCOUNTER — Other Ambulatory Visit: Payer: Self-pay | Admitting: Family Medicine

## 2021-03-25 ENCOUNTER — Other Ambulatory Visit: Payer: Self-pay | Admitting: Family Medicine

## 2021-03-25 DIAGNOSIS — N529 Male erectile dysfunction, unspecified: Secondary | ICD-10-CM

## 2021-04-16 DIAGNOSIS — R972 Elevated prostate specific antigen [PSA]: Secondary | ICD-10-CM | POA: Diagnosis not present

## 2021-04-16 DIAGNOSIS — N401 Enlarged prostate with lower urinary tract symptoms: Secondary | ICD-10-CM | POA: Diagnosis not present

## 2021-06-10 ENCOUNTER — Other Ambulatory Visit: Payer: Self-pay

## 2021-06-10 ENCOUNTER — Ambulatory Visit: Payer: BC Managed Care – PPO | Admitting: Family Medicine

## 2021-06-10 ENCOUNTER — Encounter: Payer: Self-pay | Admitting: Family Medicine

## 2021-06-10 VITALS — BP 130/90 | HR 79 | Temp 98.0°F | Resp 20 | Ht 70.0 in | Wt 275.2 lb

## 2021-06-10 DIAGNOSIS — I10 Essential (primary) hypertension: Secondary | ICD-10-CM | POA: Diagnosis not present

## 2021-06-10 DIAGNOSIS — N529 Male erectile dysfunction, unspecified: Secondary | ICD-10-CM

## 2021-06-10 LAB — BASIC METABOLIC PANEL
BUN: 25 mg/dL — ABNORMAL HIGH (ref 6–23)
CO2: 27 mEq/L (ref 19–32)
Calcium: 9.3 mg/dL (ref 8.4–10.5)
Chloride: 101 mEq/L (ref 96–112)
Creatinine, Ser: 1.33 mg/dL (ref 0.40–1.50)
GFR: 58.03 mL/min — ABNORMAL LOW (ref >60.00)
Glucose, Bld: 96 mg/dL (ref 70–99)
Potassium: 3.9 mEq/L (ref 3.5–5.1)
Sodium: 138 mEq/L (ref 135–145)

## 2021-06-10 LAB — CBC WITH DIFFERENTIAL/PLATELET
Basophils Absolute: 0.1 10*3/uL (ref 0.0–0.1)
Basophils Relative: 0.8 % (ref 0.0–3.0)
Eosinophils Absolute: 0.5 10*3/uL (ref 0.0–0.7)
Eosinophils Relative: 5.8 % — ABNORMAL HIGH (ref 0.0–5.0)
HCT: 47.8 % (ref 39.0–52.0)
Hemoglobin: 16.4 g/dL (ref 13.0–17.0)
Lymphocytes Relative: 22.9 % (ref 12.0–46.0)
Lymphs Abs: 1.9 10*3/uL (ref 0.7–4.0)
MCHC: 34.4 g/dL (ref 30.0–36.0)
MCV: 100.4 fl — ABNORMAL HIGH (ref 78.0–100.0)
Monocytes Absolute: 1 10*3/uL (ref 0.1–1.0)
Monocytes Relative: 12.2 % — ABNORMAL HIGH (ref 3.0–12.0)
Neutro Abs: 4.8 10*3/uL (ref 1.4–7.7)
Neutrophils Relative %: 58.3 % (ref 43.0–77.0)
Platelets: 152 10*3/uL (ref 150.0–400.0)
RBC: 4.76 Mil/uL (ref 4.22–5.81)
RDW: 13.8 % (ref 11.5–15.5)
WBC: 8.2 10*3/uL (ref 4.0–10.5)

## 2021-06-10 LAB — LIPID PANEL
Cholesterol: 185 mg/dL (ref 0–200)
HDL: 49.2 mg/dL (ref >39.00)
LDL Cholesterol: 114 mg/dL — ABNORMAL HIGH (ref 0–99)
NonHDL: 135.79
Total CHOL/HDL Ratio: 4
Triglycerides: 108 mg/dL (ref 0.0–149.0)
VLDL: 21.6 mg/dL (ref 0.0–40.0)

## 2021-06-10 LAB — HEPATIC FUNCTION PANEL
ALT: 18 U/L (ref 0–53)
AST: 23 U/L (ref 0–37)
Albumin: 4.2 g/dL (ref 3.5–5.2)
Alkaline Phosphatase: 119 U/L — ABNORMAL HIGH (ref 39–117)
Bilirubin, Direct: 0.1 mg/dL (ref 0.0–0.3)
Total Bilirubin: 0.7 mg/dL (ref 0.2–1.2)
Total Protein: 6.7 g/dL (ref 6.0–8.3)

## 2021-06-10 LAB — TSH: TSH: 3.15 u[IU]/mL (ref 0.35–4.50)

## 2021-06-10 MED ORDER — AMLODIPINE BESYLATE 10 MG PO TABS: 10.0000 mg | ORAL_TABLET | Freq: Every day | ORAL | 1 refills | Status: DC

## 2021-06-10 MED ORDER — ALLOPURINOL 300 MG PO TABS: 300.0000 mg | ORAL_TABLET | Freq: Every day | ORAL | 1 refills | Status: DC

## 2021-06-10 MED ORDER — SILDENAFIL CITRATE 100 MG PO TABS: ORAL_TABLET | ORAL | 10 refills | Status: DC

## 2021-06-10 MED ORDER — TELMISARTAN-HCTZ 80-12.5 MG PO TABS: 1.0000 | ORAL_TABLET | Freq: Every day | ORAL | 1 refills | Status: DC

## 2021-06-10 NOTE — Progress Notes (Signed)
   Subjective:    Patient ID: Keith Haynes, male    DOB: May 31, 1961, 60 y.o.   MRN: [id number]  HPI HTN- chronic problem, on Amlodipine 10mg  daily, Telmisartan HCTZ 80/12.5mg  daily w/ adequate control here today.  Was previously also on Hydralazine.  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  Pt reports he took lasix and BP dropped ~30 points.  He was able to pass DOT exam.  Denies CP, SOB, HAs, abd pain, N/V, visual changes.  + occasional swelling  Obesity- pt has gained 13 lbs since last visit.  Pt had lost weight in January due to [place] but he has regained the weight.  Not following any particular diet or exercise program.   Review of Systems For ROS see HPI   This visit occurred during the SARS-CoV-2 public health emergency.  Safety protocols were in place, including screening questions prior to the visit, additional usage of staff PPE, and extensive cleaning of exam room while observing appropriate contact time as indicated for disinfecting solutions.      Objective:   Physical Exam Vitals reviewed.  Constitutional:      General: He is not in acute distress.    Appearance: He is well-developed. He is obese.  HENT:     Head: Normocephalic and atraumatic.  Eyes:     Extraocular Movements: Extraocular movements intact.     Conjunctiva/sclera: Conjunctivae normal.     Pupils: Pupils are equal, round, and reactive to light.  Neck:     Thyroid: No thyromegaly.  Cardiovascular:     Rate and Rhythm: Normal rate and regular rhythm.     Pulses: Normal pulses.     Heart sounds: Normal heart sounds. No murmur heard. Pulmonary:     Effort: Pulmonary effort is normal. No respiratory distress.     Breath sounds: Normal breath sounds.  Abdominal:     General: Bowel sounds are normal. There is no distension.     Palpations: Abdomen is soft.  Musculoskeletal:     Cervical back: Normal range of motion and neck supple.     Right lower leg: No edema.     Left lower leg:  No edema.  Lymphadenopathy:     Cervical: No cervical adenopathy.  Skin:    General: Skin is warm and dry.  Neurological:     General: No focal deficit present.     Mental Status: He is alert and oriented to person, place, and time.     Cranial Nerves: No cranial nerve deficit.  Psychiatric:        Mood and Affect: Mood normal.        Behavior: Behavior normal.          Assessment & Plan:

## 2021-06-10 NOTE — Patient Instructions (Signed)
Schedule your complete physical for late January/early Feb We'll notify you of your lab results and make any changes if needed Continue to work on healthy diet and regular exercise- you can do it! Take the water pill as needed for swelling Call with any questions or concerns Stay Safe!  Stay Healthy! Have a great summer!!!

## 2021-06-10 NOTE — Assessment & Plan Note (Signed)
Chronic problem.  Adequate control today on Amlodipine and Telmisartan HCTZ.  Reports he needs refill on Lasix to take as needed when his ankles are swollen- this also improves BP.  Check labs.  No anticipated med changes.

## 2021-06-10 NOTE — Addendum Note (Signed)
Addended by: Midge Minium on: 06/10/2021 10:22 AM   Modules accepted: Orders

## 2021-06-10 NOTE — Assessment & Plan Note (Signed)
Deteriorated.  Pt has gained 13 lbs since last visit.  Encouraged healthy diet and regular exercise.  Check labs to risk stratify.

## 2021-06-11 ENCOUNTER — Encounter: Payer: Self-pay | Admitting: *Deleted

## 2021-06-18 ENCOUNTER — Encounter: Payer: Self-pay | Admitting: Family Medicine

## 2021-06-19 ENCOUNTER — Other Ambulatory Visit: Payer: Self-pay

## 2021-06-19 DIAGNOSIS — I10 Essential (primary) hypertension: Secondary | ICD-10-CM

## 2021-06-26 ENCOUNTER — Ambulatory Visit: Payer: BC Managed Care – PPO | Admitting: Family Medicine

## 2021-07-08 DIAGNOSIS — Z8619 Personal history of other infectious and parasitic diseases: Secondary | ICD-10-CM | POA: Insufficient documentation

## 2021-07-21 NOTE — Progress Notes (Signed)
Cardiology Office Note:    Date:  07/22/2021   ID:  [name], DOB [birth year], MRN [id number]  PCP:  Midge Minium, MD  Cardiologist:  Shirlee More, MD   Referring MD: Midge Minium, MD  ASSESSMENT:    1. Resistant hypertension   2. Hypertensive chronic kidney disease, unspecified CKD stage   3. Loud snoring    PLAN:    In order of problems listed above:  Unfortunately his hypertension has become resistant again, he is not tolerating furosemide abdominal discontinue with mild CKD put him on small dose of MRA add clonidine and assess for obstructive sleep apnea which I suspect is a major problem here and causing his resistance to antihypertensive agents.  With his CKD he will have a renal vascular duplex performed patient  Next appointment 4 weeks   Medication Adjustments/Labs and Tests Ordered: Current medicines are reviewed at length with the patient today.  Concerns regarding medicines are outlined above.  Orders Placed This Encounter  Procedures   Basic metabolic panel   Pro b natriuretic peptide (BNP)   EKG 12-Lead   Split night study   VAS US RENAL ARTERY DUPLEX    Meds ordered this encounter  Medications   eplerenone (INSPRA) 25 MG tablet    Sig: Take 1 tablet (25 mg total) by mouth daily.    Dispense:  90 tablet    Refill:  3   cloNIDine (CATAPRES) 0.1 MG tablet    Sig: Take 1 tablet (0.1 mg total) by mouth at bedtime.    Dispense:  90 tablet    Refill:  3      Chief Complaint  Patient presents with   Hypertension     History of Present Illness:    Keith Haynes is a 60 y.o. male who is being seen today for the evaluation of hypertension at the request of Tabori, Aundra Millet, MD.  I cannot review the records but I remember meeting him likely in the range of 10 to 15 years ago he was good friends of Dr. Efraim Kaufmann oral surgeon or community. Had a history of resistant hypertension at that time were able to get him to [facility] and he had  an evaluation and did not have renal artery stenosis. 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 Take a calcium channel blocker and has peripheral edema Rarely takes furosemide because of gout. He is compliant with his medications No alcohol abuse No known history of kidney disease.  Recent labs showed a creatinine of 1.33. No chest pain shortness of breath palpitation or syncope.  Most recent labs 06/10/2021 cholesterol 185 LDL 114 triglycerides 108 HDL 49 A1c 5.6% hemoglobin 16.4 creatinine 1.33 potassium 3.9 Past Medical History:  Diagnosis Date   Basal cell carcinoma 10/03/2014   right lower eyelid-MOHS   Gout    History of chicken pox    Hypertension    Melanoma ([place]) 04/17/2014   left midback-exc.   SCC (squamous cell carcinoma) 09/13/2014   right hand-exc.   SCC (squamous cell carcinoma) 08/07/2013   left upper forehead-tx p bx    Past Surgical History:  Procedure Laterality Date   CHOLECYSTECTOMY  1998    Current Medications: Current Meds  Medication Sig   allopurinol (ZYLOPRIM) 300 MG tablet Take 1 tablet (300 mg total) by mouth daily.   amLODipine (NORVASC) 10 MG tablet Take 1 tablet (10 mg total) by mouth daily.   cloNIDine (CATAPRES) 0.1 MG tablet Take 1 tablet (0.1 mg total) by mouth at bedtime.   colchicine 0.6 MG tablet TAKE 1 TABLET (0.6 MG TOTAL) BY MOUTH 2 (TWO) TIMES DAILY. (Patient taking differently: Take 0.6 mg by mouth 2 (two) times daily as needed (Gout).)   eplerenone (INSPRA) 25 MG tablet Take 1 tablet (25 mg total) by mouth daily.   predniSONE (DELTASONE) 10 MG tablet 3 tabs x3 days and then 2 tabs x3 days and then  1 tab x3 days.  Take w/ food. (Patient taking differently: 3 tabs x3 days and then 2 tabs x3 days and then 1 tab x3 days.  Take w/ food prn for gout)   sildenafil (VIAGRA) 100 MG tablet TAKE 1/2 TO 1 TABLET (50-100 MG TOTAL) BY MOUTH DAILY AS NEEDED FOR ERECTILE DYSFUNCTION.   telmisartan-hydrochlorothiazide (MICARDIS HCT) 80-12.5 MG tablet Take 1 tablet by mouth daily.   [DISCONTINUED] furosemide (LASIX) 20 MG tablet Take 20 mg by mouth daily as needed for fluid or edema.     Allergies:   Patient has no known allergies.   Social History   Socioeconomic History   Marital status: Married    Spouse name: Not on file   Number of children: Not on file   Years of education: Not on file   Highest education level: Not on file  Occupational History   Not on file  Tobacco Use   Smoking status: Never   Smokeless tobacco: Never  Vaping Use   Vaping Use: Never used  Substance and Sexual Activity   Alcohol use: Yes   Drug use: No   Sexual activity: Not on file  Other Topics Concern   Not on file  Social History Narrative   Not on file   Social Determinants of Health   Financial Resource Strain: Not on file  Food Insecurity: Not on file  Transportation Needs: Not on file  Physical Activity: Not on file  Stress: Not on file  Social Connections: Not on file     Family History: The patient's family history includes Diabetes in his father; Hypertension in his mother; Urinary tract infection (age of onset: 38) in his father.  ROS:   ROS Please see the history of present illness.     All other systems reviewed and are negative.  EKGs/Labs/Other Studies Reviewed:    The following studies were reviewed today:   EKG:  EKG is  ordered today.  The ekg ordered today is personally reviewed and demonstrates sinus rhythm normal EKG  Recent Labs: 06/10/2021: ALT 18; BUN 25; Creatinine, Ser 1.33; Hemoglobin 16.4; Platelets 152.0; Potassium 3.9; Sodium 138; TSH 3.15  Recent Lipid Panel     Component Value Date/Time   CHOL 185 06/10/2021 1002   TRIG 108.0 06/10/2021 1002   HDL 49.20 06/10/2021 1002   CHOLHDL 4 06/10/2021 1002   VLDL 21.6 06/10/2021 1002   LDLCALC 114 (H) 06/10/2021 1002   LDLCALC 110 (H) 12/22/2019 0848    Physical Exam:    VS:  BP (!) 162/107 (BP Location: Left Arm, Patient Position: Sitting)   Pulse 80   Ht '5\' 8"'$  (1.727 m)   Wt 278 lb (126.1 kg)  SpO2 97%   BMI 42.27 kg/m     Wt Readings from Last 3 Encounters:  07/22/21 278 lb (126.1 kg)  06/10/21 275 lb 3.2 oz (124.8 kg)  01/10/21 262 lb 6.4 oz (119 kg)     GEN: Marked obesity BMI greater than 40 thick neck obstructive sleep apnea appearance well nourished, well developed in no acute distress HEENT: Normal NECK: No JVD; No carotid bruits LYMPHATICS: No lymphadenopathy CARDIAC: RRR, no murmurs, rubs, gallops RESPIRATORY:  Clear to auscultation without rales, wheezing or rhonchi  ABDOMEN: Soft, non-tender, non-distended MUSCULOSKELETAL: He has 1-2+ lower extremity edema typical calcium channel blocker edema; No deformity  SKIN: Warm and dry NEUROLOGIC:  Alert and oriented x 3 PSYCHIATRIC:  Normal affect     Signed, Shirlee More, MD  07/22/2021 12:04 PM    [place]

## 2021-07-22 ENCOUNTER — Encounter: Payer: Self-pay | Admitting: Cardiology

## 2021-07-22 ENCOUNTER — Ambulatory Visit: Payer: BC Managed Care – PPO | Admitting: Cardiology

## 2021-07-22 ENCOUNTER — Other Ambulatory Visit: Payer: Self-pay

## 2021-07-22 VITALS — BP 162/107 | HR 80 | Ht 68.0 in | Wt 278.0 lb

## 2021-07-22 DIAGNOSIS — I10 Essential (primary) hypertension: Secondary | ICD-10-CM

## 2021-07-22 DIAGNOSIS — R0683 Snoring: Secondary | ICD-10-CM | POA: Diagnosis not present

## 2021-07-22 DIAGNOSIS — I129 Hypertensive chronic kidney disease with stage 1 through stage 4 chronic kidney disease, or unspecified chronic kidney disease: Secondary | ICD-10-CM | POA: Diagnosis not present

## 2021-07-22 MED ORDER — CLONIDINE HCL 0.1 MG PO TABS: 0.1000 mg | ORAL_TABLET | Freq: Every day | ORAL | 3 refills | Status: DC

## 2021-07-22 MED ORDER — EPLERENONE 25 MG PO TABS: 25.0000 mg | ORAL_TABLET | Freq: Every day | ORAL | 3 refills | Status: DC

## 2021-07-22 NOTE — Patient Instructions (Signed)
Medication Instructions:  Your physician has recommended you make the following change in your medication:  STOP: Furosemide  START: INSPRA 30 mg take one tablet by mouth daily.  START: Catapres 0.1 mg take one tablet by mouth daily at bedtime.  *If you need a refill on your cardiac medications before your next appointment, please call your pharmacy*   Lab Work: Your physician recommends that you return for lab work in: 2 weeks BMP, ProBNP If you have labs (blood work) drawn today and your tests are completely normal, you will receive your results only by: [place] (if you have MyChart) OR A paper copy in the mail If you have any lab test that is abnormal or we need to change your treatment, we will call you to review the results.   Testing/Procedures: Your physician has requested that you have a renal artery duplex. During this test, an ultrasound is used to evaluate blood flow to the kidneys. Allow one hour for this exam. Do not eat after midnight the day before and avoid carbonated beverages. Take your medications as you usually do.  We have placed the order for you to have a sleep study completed. They will call you to schedule this directly.    Follow-Up: At [facility], you and your health needs are our priority.  As part of our continuing mission to provide you with exceptional heart care, we have created designated Provider Care Teams.  These Care Teams include your primary Cardiologist (physician) and Advanced Practice Providers (APPs -  Physician Assistants and Nurse Practitioners) who all work together to provide you with the care you need, when you need it.  We recommend signing up for the patient portal called "MyChart".  Sign up information is provided on this After Visit Summary.  MyChart is used to connect with patients for Virtual Visits (Telemedicine).  Patients are able to view lab/test results, encounter notes, upcoming appointments, etc.  Non-urgent messages  can be sent to your provider as well.   To learn more about what you can do with MyChart, go to [email or web address].    Your next appointment:   3 week(s)  The format for your next appointment:   In Person  Provider:   Shirlee More, MD   Other Instructions

## 2021-07-23 ENCOUNTER — Telehealth: Payer: Self-pay | Admitting: *Deleted

## 2021-07-23 NOTE — Telephone Encounter (Signed)
-----   Message from Gita Kudo, RN sent at 07/22/2021  9:14 AM EDT ----- Please schedule patient for a split night sleep study.   Thanks,  Lilia Pro, RN

## 2021-07-23 NOTE — Telephone Encounter (Signed)
Prior Authorization for split night sleep study sent to [facility] via web portal. Request denied. Patient does not meet co morbidity requirements. HST was approved. Message sent to Resa Miner to order and schedule HST. Auth # [id number]. Valid dates 07/23/21 to 09/20/21.

## 2021-07-25 ENCOUNTER — Telehealth: Payer: Self-pay

## 2021-07-25 ENCOUNTER — Other Ambulatory Visit: Payer: Self-pay

## 2021-07-25 DIAGNOSIS — R0683 Snoring: Secondary | ICD-10-CM

## 2021-07-25 NOTE — Telephone Encounter (Signed)
Spoke to the patient just now and let him know that his sleep study is scheduled for 08/26/2021 at 12pm. I was told that they would send him a packet in the mail with instructions and directions and I let the patient know this as well.

## 2021-08-05 DIAGNOSIS — D485 Neoplasm of uncertain behavior of skin: Secondary | ICD-10-CM | POA: Diagnosis not present

## 2021-08-05 DIAGNOSIS — Z85820 Personal history of malignant melanoma of skin: Secondary | ICD-10-CM | POA: Diagnosis not present

## 2021-08-05 DIAGNOSIS — L570 Actinic keratosis: Secondary | ICD-10-CM | POA: Diagnosis not present

## 2021-08-05 DIAGNOSIS — L918 Other hypertrophic disorders of the skin: Secondary | ICD-10-CM | POA: Diagnosis not present

## 2021-08-07 DIAGNOSIS — C44329 Squamous cell carcinoma of skin of other parts of face: Secondary | ICD-10-CM | POA: Diagnosis not present

## 2021-08-10 NOTE — Progress Notes (Signed)
Cardiology Office Note:    Date:  08/11/2021   ID:  Keith Haynes, DOB 06/08/1961, MRN [id number]  PCP:  Midge Minium, MD  Cardiologist:  Shirlee More, MD    Referring MD: Midge Minium, MD    ASSESSMENT:    1. Resistant hypertension   2. Hypertensive chronic kidney disease, unspecified CKD stage   3. Loud snoring    PLAN:    In order of problems listed above:  Blood pressure remains poorly controlled we will add a small dose of loop diuretic being careful not to cause a flare in his gout check uric acid renal function and proceed with his renal vascular duplex and sleep study looking for precipitants that are amenable to intervention continue his multidrug regimen follow-up in the office in 6 weeks continue trending blood pressure goal   Next appointment: 2 months   Medication Adjustments/Labs and Tests Ordered: Current medicines are reviewed at length with the patient today.  Concerns regarding medicines are outlined above.  Orders Placed This Encounter  Procedures   Basic metabolic panel   Uric acid   No orders of the defined types were placed in this encounter.   Chief Complaint  Patient presents with   Follow-up   Hypertension    History of Present Illness:    Keith Haynes is a 60 y.o. male with a hx of resistant hypertension with CKD and severe snoring and a high probability of sleep apnea last seen 0879 2022.  I intensified his medical treatment adding evening clonidine and a small dose of mRNA cautiously with background creatinine of 1.33.Most recent labs 06/10/2021 cholesterol 185 LDL 114 triglycerides 108 HDL 49 A1c 5.6% hemoglobin 16.4 creatinine 1.33 potassium 3.9   Compliance with diet, lifestyle and medications: Yes Is frustrated edema is unchanged after having to stop diuretic and home blood pressure remains in range of 150/105. In the past he did well when he took a loop diuretic I will restart furosemide 20 mg 3 days a week  His sleep  study is scheduled Is scheduled for renal vascular duplex later this week recheck renal function today for GFR and potassium and his insurance is directed until home sleep study.   Past Surgical History:  Procedure Laterality Date   CHOLECYSTECTOMY  1998    Current Medications: Current Meds  Medication Sig   allopurinol (ZYLOPRIM) 300 MG tablet Take 1 tablet (300 mg total) by mouth daily.   amLODipine (NORVASC) 10 MG tablet Take 1 tablet (10 mg total) by mouth daily.   cloNIDine (CATAPRES) 0.1 MG tablet Take 1 tablet (0.1 mg total) by mouth at bedtime.   colchicine 0.6 MG tablet TAKE 1 TABLET (0.6 MG TOTAL) BY MOUTH 2 (TWO) TIMES DAILY. (Patient taking differently: Take 0.6 mg by mouth 2 (two) times daily as needed (Gout).)   eplerenone (INSPRA) 25 MG tablet Take 1 tablet (25 mg total) by mouth daily.   predniSONE (DELTASONE) 10 MG tablet 3 tabs x3 days and then 2 tabs x3 days and then 1 tab x3 days.  Take w/ food. (Patient taking differently: 3 tabs x3 days and then 2 tabs x3 days and then 1 tab x3 days.  Take w/ food prn for gout)   sildenafil (VIAGRA) 100 MG tablet TAKE 1/2 TO 1 TABLET (50-100 MG TOTAL) BY MOUTH DAILY AS NEEDED FOR ERECTILE DYSFUNCTION.   telmisartan-hydrochlorothiazide (MICARDIS HCT) 80-12.5 MG tablet Take 1 tablet by mouth daily.     Allergies:   Patient has no  known allergies.   Social History   Socioeconomic History   Marital status: Married    Spouse name: Not on file   Number of children: Not on file   Years of education: Not on file   Highest education level: Not on file  Occupational History   Not on file  Tobacco Use   Smoking status: Never   Smokeless tobacco: Never  Vaping Use   Vaping Use: Never used  Substance and Sexual Activity   Alcohol use: Yes   Drug use: No   Sexual activity: Not on file  Other Topics Concern   Not on file  Social History Narrative   Not on file   Social Determinants of Health   Financial Resource Strain: Not on  file  Food Insecurity: Not on file  Transportation Needs: Not on file  Physical Activity: Not on file  Stress: Not on file  Social Connections: Not on file     Family History: The patient's family history includes Diabetes in his father; Hypertension in his mother; Urinary tract infection (age of onset: 50) in his father. ROS:   Please see the history of present illness.    All other systems reviewed and are negative.  EKGs/Labs/Other Studies Reviewed:    The following studies were reviewed today:    Recent Labs: 06/10/2021: ALT 18; BUN 25; Creatinine, Ser 1.33; Hemoglobin 16.4; Platelets 152.0; Potassium 3.9; Sodium 138; TSH 3.15  Recent Lipid Panel    Component Value Date/Time   CHOL 185 06/10/2021 1002   TRIG 108.0 06/10/2021 1002   HDL 49.20 06/10/2021 1002   CHOLHDL 4 06/10/2021 1002   VLDL 21.6 06/10/2021 1002   LDLCALC 114 (H) 06/10/2021 1002   LDLCALC 110 (H) 12/22/2019 0848    Physical Exam:    VS:  BP (!) 172/118 (BP Location: Left Arm)   Pulse 80   Ht '5\' 9"'$  (1.753 m)   Wt 275 lb (124.7 kg)   SpO2 95%   BMI 40.61 kg/m     Wt Readings from Last 3 Encounters:  08/11/21 275 lb (124.7 kg)  07/22/21 278 lb (126.1 kg)  06/10/21 275 lb 3.2 oz (124.8 kg)     GEN:  Well nourished, well developed in no acute distress HEENT: Normal NECK: No JVD; No carotid bruits LYMPHATICS: No lymphadenopathy CARDIAC: RRR, no murmurs, rubs, gallops RESPIRATORY:  Clear to auscultation without rales, wheezing or rhonchi  ABDOMEN: Soft, non-tender, non-distended MUSCULOSKELETAL: He has 2+ lower extremity bilateral pitting edema; No deformity  SKIN: Warm and dry NEUROLOGIC:  Alert and oriented x 3 PSYCHIATRIC:  Normal affect    Signed, Shirlee More, MD  08/11/2021 8:18 AM    [place]

## 2021-08-11 ENCOUNTER — Ambulatory Visit: Payer: BC Managed Care – PPO | Admitting: Cardiology

## 2021-08-11 ENCOUNTER — Encounter: Payer: Self-pay | Admitting: Cardiology

## 2021-08-11 ENCOUNTER — Other Ambulatory Visit: Payer: Self-pay

## 2021-08-11 VITALS — BP 172/118 | HR 80 | Ht 69.0 in | Wt 275.0 lb

## 2021-08-11 DIAGNOSIS — I10 Essential (primary) hypertension: Secondary | ICD-10-CM

## 2021-08-11 DIAGNOSIS — I129 Hypertensive chronic kidney disease with stage 1 through stage 4 chronic kidney disease, or unspecified chronic kidney disease: Secondary | ICD-10-CM | POA: Diagnosis not present

## 2021-08-11 DIAGNOSIS — R0683 Snoring: Secondary | ICD-10-CM | POA: Diagnosis not present

## 2021-08-11 MED ORDER — FUROSEMIDE 20 MG PO TABS: 20.0000 mg | ORAL_TABLET | ORAL | 3 refills | Status: DC

## 2021-08-11 NOTE — Patient Instructions (Signed)
Medication Instructions:  Your physician has recommended you make the following change in your medication:  RESTART: Furosemide 20 mg take one tablet by mouth on Monday, Wednesday, and Friday *If you need a refill on your cardiac medications before your next appointment, please call your pharmacy*   Lab Work: Your physician recommends that you return for lab work in: TODAY BMP, Uric acid If you have labs (blood work) drawn today and your tests are completely normal, you will receive your results only by: [place] (if you have MyChart) OR A paper copy in the mail If you have any lab test that is abnormal or we need to change your treatment, we will call you to review the results.   Testing/Procedures: None   Follow-Up: At [facility], you and your health needs are our priority.  As part of our continuing mission to provide you with exceptional heart care, we have created designated Provider Care Teams.  These Care Teams include your primary Cardiologist (physician) and Advanced Practice Providers (APPs -  Physician Assistants and Nurse Practitioners) who all work together to provide you with the care you need, when you need it.  We recommend signing up for the patient portal called "MyChart".  Sign up information is provided on this After Visit Summary.  MyChart is used to connect with patients for Virtual Visits (Telemedicine).  Patients are able to view lab/test results, encounter notes, upcoming appointments, etc.  Non-urgent messages can be sent to your provider as well.   To learn more about what you can do with MyChart, go to [email or web address].    Your next appointment:   2 month(s)  The format for your next appointment:   In Person  Provider:   Shirlee More, MD   Other Instructions

## 2021-08-11 NOTE — Addendum Note (Signed)
Addended by: Resa Miner I on: 08/11/2021 08:22 AM   Modules accepted: Orders

## 2021-08-12 ENCOUNTER — Telehealth: Payer: Self-pay

## 2021-08-12 LAB — BASIC METABOLIC PANEL
BUN/Creatinine Ratio: 16 (ref 10–24)
BUN: 23 mg/dL (ref 8–27)
CO2: 24 mmol/L (ref 20–29)
Calcium: 9.5 mg/dL (ref 8.6–10.2)
Chloride: 101 mmol/L (ref 96–106)
Creatinine, Ser: 1.4 mg/dL — ABNORMAL HIGH (ref 0.76–1.27)
Glucose: 101 mg/dL — ABNORMAL HIGH (ref 65–99)
Potassium: 4.4 mmol/L (ref 3.5–5.2)
Sodium: 139 mmol/L (ref 134–144)
eGFR: 58 mL/min/{1.73_m2} — ABNORMAL LOW (ref >59)

## 2021-08-12 LAB — URIC ACID: Uric Acid: 9.4 mg/dL — ABNORMAL HIGH (ref 3.8–8.4)

## 2021-08-12 NOTE — Telephone Encounter (Signed)
-----   Message from Richardo Priest, MD sent at 08/12/2021  8:24 AM EDT ----- Stable renal function potassium  Uric acid is mildly elevated he is on allopurinol I would continue.

## 2021-08-12 NOTE — Telephone Encounter (Signed)
Spoke with patient regarding results and recommendation.  Patient verbalizes understanding and is agreeable to plan of care. Advised patient to call back with any issues or concerns.  

## 2021-08-13 ENCOUNTER — Other Ambulatory Visit: Payer: Self-pay

## 2021-08-13 ENCOUNTER — Ambulatory Visit (INDEPENDENT_AMBULATORY_CARE_PROVIDER_SITE_OTHER): Payer: BC Managed Care – PPO

## 2021-08-13 DIAGNOSIS — I10 Essential (primary) hypertension: Secondary | ICD-10-CM

## 2021-08-13 DIAGNOSIS — I1A0 Resistant hypertension: Secondary | ICD-10-CM

## 2021-08-14 ENCOUNTER — Telehealth: Payer: Self-pay

## 2021-08-14 NOTE — Telephone Encounter (Signed)
-----   Message from Richardo Priest, MD sent at 08/14/2021  7:54 AM EDT ----- Fortunately the arterial duplex is normal he does not have narrowing of the arteries to the kidneys continue his current medications.

## 2021-08-14 NOTE — Telephone Encounter (Signed)
Spoke with patient regarding results and recommendation.  Patient verbalizes understanding and is agreeable to plan of care. Advised patient to call back with any issues or concerns.  

## 2021-08-26 ENCOUNTER — Ambulatory Visit (HOSPITAL_BASED_OUTPATIENT_CLINIC_OR_DEPARTMENT_OTHER): Payer: BC Managed Care – PPO | Attending: Cardiology | Admitting: Cardiovascular Disease

## 2021-08-26 ENCOUNTER — Other Ambulatory Visit: Payer: Self-pay

## 2021-08-27 ENCOUNTER — Telehealth: Payer: Self-pay | Admitting: *Deleted

## 2021-08-27 NOTE — Telephone Encounter (Signed)
Received a call from [place] at the sleep lab informing me per [name] the patient has failed a HST and will need a in lab study.  Prior Authorization for split night sleep study sent to [facility] via web portal. Tracking Number [id number].

## 2021-08-29 NOTE — Telephone Encounter (Signed)
Denial received from [employer].  I called and left a message for them to return a call to me to do a appeal.

## 2021-09-02 NOTE — Telephone Encounter (Addendum)
Received a letter from [employer] stating their previous decision to deny a split night sleep study has been upheld. Keith Haynes was notified, patient will need to repeat the HST. Keith Haynes informed me that the request was actually made by the patient. The only portion that did not record on the first HST was the pulse ox. Keith Haynes will call and speak with the patient about the insurance denial and follow up testing if needed will follow accordingly.

## 2021-10-16 ENCOUNTER — Telehealth: Payer: Self-pay | Admitting: Cardiology

## 2021-10-16 NOTE — Telephone Encounter (Signed)
Called patient. Due to his schedule with his wife's sickness he asked me to schedule him for the first of the year. Scheduled him as he requested.

## 2021-10-16 NOTE — Telephone Encounter (Signed)
Keith Haynes is calling due to having to cancel his appointment for 10/20/21 due to not being able to make it. He is wanting to know if there is anyway he can be worked in for tomorrow due to him having to be over that way for another appointment. His wife has been diagnosed with cancer so his availability is limited due to having to take her back and forth. He is requesting a callback to let him know if it can be worked out.

## 2021-10-17 DIAGNOSIS — N401 Enlarged prostate with lower urinary tract symptoms: Secondary | ICD-10-CM | POA: Diagnosis not present

## 2021-10-17 DIAGNOSIS — R972 Elevated prostate specific antigen [PSA]: Secondary | ICD-10-CM | POA: Diagnosis not present

## 2021-10-20 ENCOUNTER — Ambulatory Visit: Payer: BC Managed Care – PPO | Admitting: Cardiology

## 2021-12-10 ENCOUNTER — Other Ambulatory Visit: Payer: Self-pay | Admitting: Family Medicine

## 2021-12-26 ENCOUNTER — Other Ambulatory Visit: Payer: Self-pay

## 2021-12-26 ENCOUNTER — Encounter: Payer: Self-pay | Admitting: Cardiology

## 2021-12-26 ENCOUNTER — Ambulatory Visit: Payer: BC Managed Care – PPO | Admitting: Cardiology

## 2021-12-26 VITALS — BP 158/94 | HR 80 | Ht 69.0 in | Wt 267.0 lb

## 2021-12-26 DIAGNOSIS — I10 Essential (primary) hypertension: Secondary | ICD-10-CM | POA: Diagnosis not present

## 2021-12-26 DIAGNOSIS — E785 Hyperlipidemia, unspecified: Secondary | ICD-10-CM

## 2021-12-26 MED ORDER — HYDRALAZINE HCL 25 MG PO TABS: 25.0000 mg | ORAL_TABLET | Freq: Two times a day (BID) | ORAL | 3 refills | Status: DC

## 2021-12-26 MED ORDER — AMLODIPINE BESYLATE 10 MG PO TABS: 10.0000 mg | ORAL_TABLET | ORAL | 3 refills | Status: DC

## 2021-12-26 NOTE — Progress Notes (Signed)
Cardiology Office Note:    Date:  12/26/2021   ID:  Keith Haynes, DOB 01/30/1961, MRN [id number]  PCP:  Midge Minium, MD  Cardiologist:  Shirlee More, MD    Referring MD: Midge Minium, MD    ASSESSMENT:    1. Resistant hypertension   2. Hyperlipidemia, unspecified hyperlipidemia type    PLAN:    In order of problems listed above:  Keith Haynes truly has resistant difficult to control hypertension he is on multidrug regimen now is having edema from the calcium channel blocker.  We will decrease his amlodipine every other day long-acting to mitigate edema and place him on hydralazine.  Recheck renal function and continue other agents low-dose clonidine at bedtime loop and distal diuretic and ARB thiazide combination Check lipid profile   Next appointment: 3 months   Medication Adjustments/Labs and Tests Ordered: Current medicines are reviewed at length with the patient today.  Concerns regarding medicines are outlined above.  Orders Placed This Encounter  Procedures   Comprehensive metabolic panel   Lipid panel   Meds ordered this encounter  Medications   amLODipine (NORVASC) 10 MG tablet    Sig: Take 1 tablet (10 mg total) by mouth every other day.    Dispense:  45 tablet    Refill:  3   hydrALAZINE (APRESOLINE) 25 MG tablet    Sig: Take 1 tablet (25 mg total) by mouth 2 (two) times daily.    Dispense:  180 tablet    Refill:  3    Chief Complaint  Patient presents with   Follow-up   Hypertension    History of Present Illness:    Keith Haynes is a 61 y.o. male with a hx of  resistant hypertension with CKD and severe snoring and a high probability of sleep apnea  last seen 08/11/2021.  Compliance with diet, lifestyle and medications: Yes  Sporadically checks home blood pressure runs in the range of 150/90 Said difficult year his wife has breast cancer and is undergoing chemotherapy needs additional surgical intervention Remains very active at  work no shortness of breath chest pain palpitation or syncope He is unhappy with amlodipine he has peripheral edema Past Medical History:  Diagnosis Date   Basal cell carcinoma 10/03/2014   right lower eyelid-MOHS   Gout    History of chicken pox    Hypertension    Melanoma ([place]) 04/17/2014   left midback-exc.   SCC (squamous cell carcinoma) 09/13/2014   right hand-exc.   SCC (squamous cell carcinoma) 08/07/2013   left upper forehead-tx p bx    Past Surgical History:  Procedure Laterality Date   CHOLECYSTECTOMY  1998    Current Medications: Current Meds  Medication Sig   allopurinol (ZYLOPRIM) 300 MG tablet Take 1 tablet (300 mg total) by mouth daily.   cloNIDine (CATAPRES) 0.1 MG tablet Take 1 tablet (0.1 mg total) by mouth at bedtime.   colchicine 0.6 MG tablet TAKE 1 TABLET (0.6 MG TOTAL) BY MOUTH 2 (TWO) TIMES DAILY.   eplerenone (INSPRA) 25 MG tablet Take 1 tablet (25 mg total) by mouth daily.   furosemide (LASIX) 20 MG tablet Take 1 tablet (20 mg total) by mouth 3 (three) times a week.   hydrALAZINE (APRESOLINE) 25 MG tablet Take 1 tablet (25 mg total) by mouth 2 (two) times daily.   predniSONE (DELTASONE) 10 MG tablet 3 tabs x3 days and then 2 tabs x3 days and then 1 tab x3 days.  Take w/ food. (  Patient taking differently: 3 tabs x3 days and then 2 tabs x3 days and then 1 tab x3 days.  Take w/ food prn for gout)   sildenafil (VIAGRA) 100 MG tablet TAKE 1/2 TO 1 TABLET (50-100 MG TOTAL) BY MOUTH DAILY AS NEEDED FOR ERECTILE DYSFUNCTION.   telmisartan-hydrochlorothiazide (MICARDIS HCT) 80-12.5 MG tablet Take 1 tablet by mouth daily.   [DISCONTINUED] amLODipine (NORVASC) 10 MG tablet Take 1 tablet (10 mg total) by mouth daily.     Allergies:   Patient has no known allergies.   Social History   Socioeconomic History   Marital status: Married    Spouse name: Not on file   Number of children: Not on file   Years of education: Not on file   Highest education level: Not  on file  Occupational History   Not on file  Tobacco Use   Smoking status: Never    Passive exposure: Never   Smokeless tobacco: Never  Vaping Use   Vaping Use: Never used  Substance and Sexual Activity   Alcohol use: Yes   Drug use: No   Sexual activity: Not on file  Other Topics Concern   Not on file  Social History Narrative   Not on file   Social Determinants of Health   Financial Resource Strain: Not on file  Food Insecurity: Not on file  Transportation Needs: Not on file  Physical Activity: Not on file  Stress: Not on file  Social Connections: Not on file     Family History: The patient's family history includes Diabetes in his father; Hypertension in his mother; Urinary tract infection (age of onset: 50) in his father. ROS:   Please see the history of present illness.    All other systems reviewed and are negative.  EKGs/Labs/Other Studies Reviewed:    The following studies were reviewed today:    Recent Labs: 06/10/2021: ALT 18; Hemoglobin 16.4; Platelets 152.0; TSH 3.15 08/11/2021: BUN 23; Creatinine, Ser 1.40; Potassium 4.4; Sodium 139  Recent Lipid Panel    Component Value Date/Time   CHOL 185 06/10/2021 1002   TRIG 108.0 06/10/2021 1002   HDL 49.20 06/10/2021 1002   CHOLHDL 4 06/10/2021 1002   VLDL 21.6 06/10/2021 1002   LDLCALC 114 (H) 06/10/2021 1002   LDLCALC 110 (H) 12/22/2019 0848    Physical Exam:    VS:  BP (!) 158/94 (BP Location: Left Arm)    Pulse 80    Ht 5\' 9"  (1.753 m)    Wt 267 lb (121.1 kg)    SpO2 96%    BMI 39.43 kg/m     Wt Readings from Last 3 Encounters:  12/26/21 267 lb (121.1 kg)  08/11/21 275 lb (124.7 kg)  07/22/21 278 lb (126.1 kg)    Repeat blood pressure by me 150/90 left upper extremity large cuff GEN:  Well nourished, well developed in no acute distress HEENT: Normal NECK: No JVD; No carotid bruits LYMPHATICS: No lymphadenopathy CARDIAC: RRR, no murmurs, rubs, gallops RESPIRATORY:  Clear to auscultation  without rales, wheezing or rhonchi  ABDOMEN: Soft, non-tender, non-distended MUSCULOSKELETAL: There is 1-2+ lower extremity about the ankle daily calcium channel blocker edema edema; No deformity  SKIN: Warm and dry NEUROLOGIC:  Alert and oriented x 3 PSYCHIATRIC:  Normal affect    Signed, Shirlee More, MD  12/26/2021 9:45 AM    [place]

## 2021-12-26 NOTE — Patient Instructions (Signed)
Medication Instructions:  Your physician has recommended you make the following change in your medication:  DECREASE: Amlodipine 10 mg take one tablet every other day.  INCREASE: Hydralazine 25 mg take one tablet by mouth twice daily.  *If you need a refill on your cardiac medications before your next appointment, please call your pharmacy*   Lab Work: Your physician recommends that you return for lab work in: [place], Lipids If you have labs (blood work) drawn today and your tests are completely normal, you will receive your results only by: [place] (if you have MyChart) OR A paper copy in the mail If you have any lab test that is abnormal or we need to change your treatment, we will call you to review the results.   Testing/Procedures: None   Follow-Up: At [facility], you and your health needs are our priority.  As part of our continuing mission to provide you with exceptional heart care, we have created designated Provider Care Teams.  These Care Teams include your primary Cardiologist (physician) and Advanced Practice Providers (APPs -  Physician Assistants and Nurse Practitioners) who all work together to provide you with the care you need, when you need it.  We recommend signing up for the patient portal called "MyChart".  Sign up information is provided on this After Visit Summary.  MyChart is used to connect with patients for Virtual Visits (Telemedicine).  Patients are able to view lab/test results, encounter notes, upcoming appointments, etc.  Non-urgent messages can be sent to your provider as well.   To learn more about what you can do with MyChart, go to [email or web address].    Your next appointment:   3 month(s)  The format for your next appointment:   In Person  Provider:   Shirlee More, MD    Other Instructions

## 2021-12-27 LAB — COMPREHENSIVE METABOLIC PANEL
ALT: 29 IU/L (ref 0–44)
AST: 33 IU/L (ref 0–40)
Albumin/Globulin Ratio: 1.9 (ref 1.2–2.2)
Albumin: 4.5 g/dL (ref 3.8–4.8)
Alkaline Phosphatase: 137 IU/L — ABNORMAL HIGH (ref 44–121)
BUN/Creatinine Ratio: 19 (ref 10–24)
BUN: 22 mg/dL (ref 8–27)
Bilirubin Total: 0.4 mg/dL (ref 0.0–1.2)
CO2: 23 mmol/L (ref 20–29)
Calcium: 9.4 mg/dL (ref 8.6–10.2)
Chloride: 102 mmol/L (ref 96–106)
Creatinine, Ser: 1.14 mg/dL (ref 0.76–1.27)
Globulin, Total: 2.4 g/dL (ref 1.5–4.5)
Glucose: 109 mg/dL — ABNORMAL HIGH (ref 70–99)
Potassium: 4.2 mmol/L (ref 3.5–5.2)
Sodium: 141 mmol/L (ref 134–144)
Total Protein: 6.9 g/dL (ref 6.0–8.5)
eGFR: 73 mL/min/{1.73_m2} (ref >59)

## 2021-12-27 LAB — LIPID PANEL
Chol/HDL Ratio: 3.1 ratio (ref 0.0–5.0)
Cholesterol, Total: 165 mg/dL (ref 100–199)
HDL: 54 mg/dL (ref >39)
LDL Chol Calc (NIH): 93 mg/dL (ref 0–99)
Triglycerides: 98 mg/dL (ref 0–149)
VLDL Cholesterol Cal: 18 mg/dL (ref 5–40)

## 2021-12-29 ENCOUNTER — Telehealth: Payer: Self-pay | Admitting: Cardiology

## 2021-12-29 NOTE — Telephone Encounter (Signed)
Patient informed of result.

## 2021-12-29 NOTE — Telephone Encounter (Signed)
Patient called in returning Richard's call in regards to his lab results. Transferred call to [place].

## 2022-01-07 ENCOUNTER — Other Ambulatory Visit: Payer: Self-pay | Admitting: Family Medicine

## 2022-01-08 ENCOUNTER — Encounter: Payer: Self-pay | Admitting: Family Medicine

## 2022-01-08 ENCOUNTER — Other Ambulatory Visit (INDEPENDENT_AMBULATORY_CARE_PROVIDER_SITE_OTHER): Payer: BC Managed Care – PPO

## 2022-01-08 ENCOUNTER — Other Ambulatory Visit: Payer: Self-pay

## 2022-01-08 ENCOUNTER — Ambulatory Visit (INDEPENDENT_AMBULATORY_CARE_PROVIDER_SITE_OTHER): Payer: BC Managed Care – PPO | Admitting: Family Medicine

## 2022-01-08 VITALS — BP 144/80 | HR 94 | Temp 98.3°F | Wt 269.4 lb

## 2022-01-08 DIAGNOSIS — Z0000 Encounter for general adult medical examination without abnormal findings: Secondary | ICD-10-CM | POA: Diagnosis not present

## 2022-01-08 DIAGNOSIS — Z1211 Encounter for screening for malignant neoplasm of colon: Secondary | ICD-10-CM

## 2022-01-08 DIAGNOSIS — Z125 Encounter for screening for malignant neoplasm of prostate: Secondary | ICD-10-CM

## 2022-01-08 DIAGNOSIS — Z1159 Encounter for screening for other viral diseases: Secondary | ICD-10-CM

## 2022-01-08 DIAGNOSIS — M1A09X0 Idiopathic chronic gout, multiple sites, without tophus (tophi): Secondary | ICD-10-CM | POA: Diagnosis not present

## 2022-01-08 DIAGNOSIS — Z114 Encounter for screening for human immunodeficiency virus [HIV]: Secondary | ICD-10-CM | POA: Diagnosis not present

## 2022-01-08 LAB — PSA: PSA: 5.42 ng/mL — ABNORMAL HIGH (ref 0.10–4.00)

## 2022-01-08 LAB — HIV ANTIBODY (ROUTINE TESTING W REFLEX): HIV 1&2 Ab, 4th Generation: NONREACTIVE

## 2022-01-08 LAB — HEPATITIS C ANTIBODY
Hepatitis C Ab: NONREACTIVE
SIGNAL TO CUT-OFF: 0.02 (ref <1.00)

## 2022-01-08 LAB — TSH: TSH: 2.48 u[IU]/mL (ref 0.35–5.50)

## 2022-01-08 LAB — URIC ACID: Uric Acid, Serum: 7.5 mg/dL (ref 4.0–7.8)

## 2022-01-08 NOTE — Assessment & Plan Note (Signed)
Pt reports adequate control of sxs.  Wants to make sure uric acid level is at goal

## 2022-01-08 NOTE — Assessment & Plan Note (Signed)
Pt's PE WNL w/ exception of obesity.  Referral placed for colonoscopy.  UTD on Tdap.  Declines flu and COVID.  Check labs.  Anticipatory guidance provided.

## 2022-01-08 NOTE — Progress Notes (Signed)
° °  Subjective:    Patient ID: Keith Haynes, male    DOB: 1961/05/07, 61 y.o.   MRN: [id number]  HPI CPE- UTD on Tdap.  Due for repeat colonoscopy.  Patient Care Team    Relationship Specialty Notifications Start End  Midge Minium, MD PCP - General Family Medicine  03/26/14   Lavonna Monarch, MD Consulting Physician Dermatology  11/04/15   Richardo Priest, MD Consulting Physician Cardiology  01/08/22   [facility], [facility]    01/08/22      Health Maintenance  Topic Date Due   HIV Screening  Never done   Hepatitis C Screening  Never done   COLONOSCOPY (Pts 45-57yrs Insurance coverage will need to be confirmed)  03/26/2021   COVID-19 Vaccine (1) 01/24/2022 (Originally 04/08/1962)   INFLUENZA VACCINE  03/13/2022 (Originally 07/14/2021)   Zoster Vaccines- Shingrix (1 of 2) 04/08/2022 (Originally 10/09/1979)   TETANUS/TDAP  01/14/2023   HPV VACCINES  Aged Out      Review of Systems Patient reports no vision/hearing changes, anorexia, fever ,adenopathy, persistant/recurrent hoarseness, swallowing issues, chest pain, palpitations, edema, persistant/recurrent cough, hemoptysis, dyspnea (rest,exertional, paroxysmal nocturnal), gastrointestinal  bleeding (melena, rectal bleeding), abdominal pain, excessive heart burn, GU symptoms (dysuria, hematuria, voiding/incontinence issues) syncope, focal weakness, memory loss, numbness & tingling, skin/hair/nail changes, depression, anxiety, abnormal bruising/bleeding, musculoskeletal symptoms/signs.   This visit occurred during the SARS-CoV-2 public health emergency.  Safety protocols were in place, including screening questions prior to the visit, additional usage of staff PPE, and extensive cleaning of exam room while observing appropriate contact time as indicated for disinfecting solutions.      Objective:   Physical Exam General Appearance:    Alert, cooperative, no distress, appears stated age, obese  Head:    Normocephalic, without  obvious abnormality, atraumatic  Eyes:    PERRL, conjunctiva/corneas clear, EOM's intact, fundi    benign, both eyes       Ears:    Normal TM's and external ear canals, both ears  Nose:   Deferred due to COVID  Throat:   Neck:   Supple, symmetrical, trachea midline, no adenopathy;       thyroid:  No enlargement/tenderness/nodules  Back:     Symmetric, no curvature, ROM normal, no CVA tenderness  Lungs:     Clear to auscultation bilaterally, respirations unlabored  Chest wall:    No tenderness or deformity  Heart:    Regular rate and rhythm, S1 and S2 normal, no murmur, rub   or gallop  Abdomen:     Soft, non-tender, bowel sounds active all four quadrants,    no masses, no organomegaly  Genitalia:    deferred  Rectal:    Extremities:   Extremities normal, atraumatic, no cyanosis or edema  Pulses:   2+ and symmetric all extremities  Skin:   Skin color, texture, turgor normal, no rashes or lesions  Lymph nodes:   Cervical, supraclavicular, and axillary nodes normal  Neurologic:   CNII-XII intact. Normal strength, sensation and reflexes      throughout          Assessment & Plan:

## 2022-01-08 NOTE — Assessment & Plan Note (Signed)
Pt's down 6 lbs since last visit.  BMI now 39.78.  Given his other medical issues, this still qualifies as morbidly obese but I applauded his efforts at healthy diet and regular exercise.  Will continue to follow.

## 2022-01-08 NOTE — Patient Instructions (Signed)
Follow up in 6 months to recheck BP, cholesterol, and weight loss progress Go to [place] to get your labs done this morning Continue to work on healthy diet and regular exercise- you're doing great! We'll call you with your GI appt for colonoscopy Call with any questions or concerns Stay Safe!  Stay Healthy! Hang in there!!!

## 2022-02-21 DIAGNOSIS — Z85820 Personal history of malignant melanoma of skin: Secondary | ICD-10-CM | POA: Diagnosis not present

## 2022-02-21 DIAGNOSIS — L814 Other melanin hyperpigmentation: Secondary | ICD-10-CM | POA: Diagnosis not present

## 2022-02-21 DIAGNOSIS — L570 Actinic keratosis: Secondary | ICD-10-CM | POA: Diagnosis not present

## 2022-02-21 DIAGNOSIS — D225 Melanocytic nevi of trunk: Secondary | ICD-10-CM | POA: Diagnosis not present

## 2022-03-05 ENCOUNTER — Encounter: Payer: BC Managed Care – PPO | Admitting: Gastroenterology

## 2022-04-10 ENCOUNTER — Ambulatory Visit: Payer: BC Managed Care – PPO | Admitting: Cardiology

## 2022-04-10 ENCOUNTER — Encounter: Payer: Self-pay | Admitting: Family Medicine

## 2022-04-14 ENCOUNTER — Encounter: Payer: Self-pay | Admitting: Gastroenterology

## 2022-05-08 ENCOUNTER — Ambulatory Visit (AMBULATORY_SURGERY_CENTER): Payer: BC Managed Care – PPO | Admitting: *Deleted

## 2022-05-08 VITALS — Ht 69.0 in | Wt 275.0 lb

## 2022-05-08 DIAGNOSIS — Z1211 Encounter for screening for malignant neoplasm of colon: Secondary | ICD-10-CM

## 2022-05-08 MED ORDER — NA SULFATE-K SULFATE-MG SULF 17.5-3.13-1.6 GM/177ML PO SOLN: 1.0000 | ORAL | 0 refills | Status: DC

## 2022-05-08 NOTE — Progress Notes (Signed)
Patient's pre-visit was done today over the phone with the patient. Name,DOB and address verified. Patient denies any allergies to Eggs and Soy. Patient denies any problems with anesthesia/sedation. Patient is not taking any diet pills or blood thinners. No home Oxygen. Insurance confirmed with patient.  Prep instructions sent to pt's MyChart -pt is aware. Patient understands to call us back with any questions or concerns. Patient is aware of our care-partner policy. Pt will use [facility] for rx.  EMMI education assigned to the patient for the procedure, sent to [place].

## 2022-05-26 ENCOUNTER — Encounter: Payer: Self-pay | Admitting: Gastroenterology

## 2022-05-29 ENCOUNTER — Ambulatory Visit (AMBULATORY_SURGERY_CENTER): Payer: BC Managed Care – PPO | Admitting: Gastroenterology

## 2022-05-29 ENCOUNTER — Encounter: Payer: Self-pay | Admitting: Gastroenterology

## 2022-05-29 VITALS — BP 136/70 | HR 83 | Temp 98.0°F | Resp 15 | Ht 69.0 in | Wt 275.0 lb

## 2022-05-29 DIAGNOSIS — Z1211 Encounter for screening for malignant neoplasm of colon: Secondary | ICD-10-CM | POA: Diagnosis not present

## 2022-05-29 DIAGNOSIS — D120 Benign neoplasm of cecum: Secondary | ICD-10-CM

## 2022-05-29 DIAGNOSIS — D128 Benign neoplasm of rectum: Secondary | ICD-10-CM

## 2022-05-29 DIAGNOSIS — K5730 Diverticulosis of large intestine without perforation or abscess without bleeding: Secondary | ICD-10-CM | POA: Diagnosis not present

## 2022-05-29 DIAGNOSIS — K635 Polyp of colon: Secondary | ICD-10-CM | POA: Diagnosis not present

## 2022-05-29 DIAGNOSIS — K621 Rectal polyp: Secondary | ICD-10-CM | POA: Diagnosis not present

## 2022-05-29 MED ORDER — SODIUM CHLORIDE 0.9 % IV SOLN: 500.0000 mL | Freq: Once | INTRAVENOUS | Status: DC

## 2022-05-29 NOTE — Progress Notes (Signed)
Called to room to assist during endoscopic procedure.  Patient ID and intended procedure confirmed with present staff. Received instructions for my participation in the procedure from the performing physician.  

## 2022-05-29 NOTE — Op Note (Signed)
[facility] Patient Name: Keith Haynes Procedure Date: 05/29/2022 9:32 AM MRN: [id number] Endoscopist: Gerrit Heck , MD Age: 61 Referring MD:  Date of Birth: [birth year] Gender: Male Account #: [id number] Procedure:                Colonoscopy Indications:              Screening for colorectal malignant neoplasm (last                            colonoscopy was 10 years ago)                           Normal colonoscopy in 03/2011. Medicines:                Monitored Anesthesia Care Procedure:                Pre-Anesthesia Assessment:                           - Prior to the procedure, a History and Physical                            was performed, and patient medications and                            allergies were reviewed. The patient's tolerance of                            previous anesthesia was also reviewed. The risks                            and benefits of the procedure and the sedation                            options and risks were discussed with the patient.                            All questions were answered, and informed consent                            was obtained. Prior Anticoagulants: The patient has                            taken no previous anticoagulant or antiplatelet                            agents. ASA Grade Assessment: II - A patient with                            mild systemic disease. After reviewing the risks                            and benefits, the patient was deemed in  satisfactory condition to undergo the procedure.                           After obtaining informed consent, the colonoscope                            was passed under direct vision. Throughout the                            procedure, the patient's blood pressure, pulse, and                            oxygen saturations were monitored continuously. The                            CF [id number] #[id number] was introduced through the anus                             and advanced to the the cecum, identified by                            appendiceal orifice and ileocecal valve. The                            colonoscopy was performed without difficulty. The                            patient tolerated the procedure well. The quality                            of the bowel preparation was good. The ileocecal                            valve, appendiceal orifice, and rectum were                            photographed. Scope In: 9:48:34 AM Scope Out: 10:07:02 AM Scope Withdrawal Time: 0 hours 14 minutes 48 seconds  Total Procedure Duration: 0 hours 18 minutes 28 seconds  Findings:                 The perianal and digital rectal examinations were                            normal.                           A 3 mm polyp was found in the cecum. The polyp was                            sessile. The polyp was removed with a cold snare.                            Resection and retrieval were complete. Estimated  blood loss was minimal.                           A 3 mm polyp was found in the distal rectum. The                            polyp was sessile. The polyp was removed with a                            cold snare. Resection and retrieval were complete.                            Estimated blood loss was minimal.                           A few small-mouthed diverticula were found in the                            sigmoid colon.                           Retroflexion in the rectum was not performed due to                            anatomy (narrowed rectal vault). Complications:            No immediate complications. Estimated Blood Loss:     Estimated blood loss was minimal. Impression:               - One 3 mm polyp in the cecum, removed with a cold                            snare. Resected and retrieved.                           - One 3 mm polyp in the distal rectum, removed with                             a cold snare. Resected and retrieved.                           - Mild diverticulosis in the sigmoid colon. Recommendation:           - Patient has a contact number available for                            emergencies. The signs and symptoms of potential                            delayed complications were discussed with the                            patient. Return to normal activities tomorrow.  Written discharge instructions were provided to the                            patient.                           - Resume previous diet.                           - Continue present medications.                           - Await pathology results.                           - Repeat colonoscopy for surveillance based on                            pathology results.                           - Return to GI office PRN. Gerrit Heck, MD 05/29/2022 10:15:00 AM

## 2022-05-29 NOTE — Progress Notes (Signed)
To pacu, VSS. Report to Rn,tb 

## 2022-05-29 NOTE — Progress Notes (Signed)
GASTROENTEROLOGY PROCEDURE H&P NOTE   Primary Care Physician: Midge Minium, MD    Reason for Procedure:  Colon Cancer screening  Plan:    Colonoscopy  Patient is appropriate for endoscopic procedure(s) in the ambulatory ([facility]) setting.  The nature of the procedure, as well as the risks, benefits, and alternatives were carefully and thoroughly reviewed with the patient. Ample time for discussion and questions allowed. The patient understood, was satisfied, and agreed to proceed.     HPI: Keith Haynes is a 61 y.o. male who presents for colonoscopy for routine Colon Cancer screening.  No active GI symptoms.  No known family history of colon cancer or related malignancy.  Patient is otherwise without complaints or active issues today.  Normal colonoscopy in 03/2011.   Past Medical History:  Diagnosis Date   Basal cell carcinoma 10/03/2014   right lower eyelid-MOHS   Gout    History of chicken pox    Hypertension    Melanoma ([place]) 04/17/2014   left midback-exc.   SCC (squamous cell carcinoma) 09/13/2014   right hand-exc.   SCC (squamous cell carcinoma) 08/07/2013   left upper forehead-tx p bx    Past Surgical History:  Procedure Laterality Date   CHOLECYSTECTOMY  12/14/1996   COLONOSCOPY  2012   NORMAL-per pt    Prior to Admission medications   Medication Sig Start Date End Date Taking? Authorizing Provider  allopurinol (ZYLOPRIM) 300 MG tablet TAKE 1 TABLET BY MOUTH EVERY DAY Patient taking differently: PRN 01/07/22  Yes Midge Minium, MD  amLODipine (NORVASC) 10 MG tablet Take 1 tablet (10 mg total) by mouth every other day. 12/26/21  Yes Richardo Priest, MD  Ascorbic Acid (VITAMIN C PO) Take by mouth.   Yes [provider]  colchicine 0.6 MG tablet TAKE 1 TABLET (0.6 MG TOTAL) BY MOUTH 2 (TWO) TIMES DAILY. 12/10/21  Yes Midge Minium, MD  eplerenone (INSPRA) 25 MG tablet Take 1 tablet (25 mg total) by mouth daily. 07/22/21  Yes Richardo Priest, MD  furosemide (LASIX) 20 MG tablet Take 1 tablet (20 mg total) by mouth 3 (three) times a week. 08/11/21 04/13/24 Yes Richardo Priest, MD  telmisartan-hydrochlorothiazide (MICARDIS HCT) 80-12.5 MG tablet TAKE 1 TABLET BY MOUTH EVERY DAY 01/07/22  Yes Midge Minium, MD  VITAMIN D PO Take by mouth.   Yes [provider]  zinc gluconate 50 MG tablet Take 50 mg by mouth daily.   Yes [provider]  sildenafil (VIAGRA) 100 MG tablet TAKE 1/2 TO 1 TABLET (50-100 MG TOTAL) BY MOUTH DAILY AS NEEDED FOR ERECTILE DYSFUNCTION. 06/10/21   Midge Minium, MD    Current Outpatient Medications  Medication Sig Dispense Refill   allopurinol (ZYLOPRIM) 300 MG tablet TAKE 1 TABLET BY MOUTH EVERY DAY (Patient taking differently: PRN) 90 tablet 1   amLODipine (NORVASC) 10 MG tablet Take 1 tablet (10 mg total) by mouth every other day. 45 tablet 3   Ascorbic Acid (VITAMIN C PO) Take by mouth.     colchicine 0.6 MG tablet TAKE 1 TABLET (0.6 MG TOTAL) BY MOUTH 2 (TWO) TIMES DAILY. 180 tablet 1   eplerenone (INSPRA) 25 MG tablet Take 1 tablet (25 mg total) by mouth daily. 90 tablet 3   furosemide (LASIX) 20 MG tablet Take 1 tablet (20 mg total) by mouth 3 (three) times a week. 45 tablet 3   telmisartan-hydrochlorothiazide (MICARDIS HCT) 80-12.5 MG tablet TAKE 1 TABLET BY MOUTH  EVERY DAY 90 tablet 1   VITAMIN D PO Take by mouth.     zinc gluconate 50 MG tablet Take 50 mg by mouth daily.     sildenafil (VIAGRA) 100 MG tablet TAKE 1/2 TO 1 TABLET (50-100 MG TOTAL) BY MOUTH DAILY AS NEEDED FOR ERECTILE DYSFUNCTION. 10 tablet 10   Current Facility-Administered Medications  Medication Dose Route Frequency Provider Last Rate Last Admin   0.9 %  sodium chloride infusion  500 mL Intravenous Once Kalik Hoare V, DO        Allergies as of 05/29/2022   (No Known Allergies)    Family History  Problem Relation Age of Onset   Hypertension Mother    Diabetes Father    Urinary tract  infection Father 99       Septic from UTI   Colon cancer Neg Hx    Esophageal cancer Neg Hx    Rectal cancer Neg Hx    Stomach cancer Neg Hx     Social History   Socioeconomic History   Marital status: Married    Spouse name: Not on file   Number of children: Not on file   Years of education: Not on file   Highest education level: Not on file  Occupational History   Not on file  Tobacco Use   Smoking status: Never    Passive exposure: Never   Smokeless tobacco: Never  Vaping Use   Vaping Use: Never used  Substance and Sexual Activity   Alcohol use: Yes    Alcohol/week: 21.0 standard drinks of alcohol    Types: 21 Cans of beer per week    Comment: 3 beers a day per pt   Drug use: No   Sexual activity: Not Currently  Other Topics Concern   Not on file  Social History Narrative   Not on file   Social Determinants of Health   Financial Resource Strain: Not on file  Food Insecurity: Not on file  Transportation Needs: Not on file  Physical Activity: Not on file  Stress: Not on file  Social Connections: Not on file  Intimate Partner Violence: Not on file    Physical Exam: Vital signs in last 24 hours: '@BP'$  125/80   Pulse 81   Temp 98 F (36.7 C)   Ht '5\' 9"'$  (1.753 m)   Wt 275 lb (124.7 kg)   SpO2 94%   BMI 40.61 kg/m  GEN: NAD EYE: Sclerae anicteric ENT: MMM CV: Non-tachycardic Pulm: CTA b/l GI: Soft, NT/ND NEURO:  Alert & Oriented x 3   Gerrit Heck, DO [facility]   05/29/2022 9:40 AM

## 2022-05-29 NOTE — Patient Instructions (Signed)
Discharge instructions given. Handouts on polyps and Diverticulosis. Resume previous medications. YOU HAD AN ENDOSCOPIC PROCEDURE TODAY AT [facility]:   Refer to the procedure report that was given to you for any specific questions about what was found during the examination.  If the procedure report does not answer your questions, please call your gastroenterologist to clarify.  If you requested that your care partner not be given the details of your procedure findings, then the procedure report has been included in a sealed envelope for you to review at your convenience later.  YOU SHOULD EXPECT: Some feelings of bloating in the abdomen. Passage of more gas than usual.  Walking can help get rid of the air that was put into your GI tract during the procedure and reduce the bloating. If you had a lower endoscopy (such as a colonoscopy or flexible sigmoidoscopy) you may notice spotting of blood in your stool or on the toilet paper. If you underwent a bowel prep for your procedure, you may not have a normal bowel movement for a few days.  Please Note:  You might notice some irritation and congestion in your nose or some drainage.  This is from the oxygen used during your procedure.  There is no need for concern and it should clear up in a day or so.  SYMPTOMS TO REPORT IMMEDIATELY:  Following lower endoscopy (colonoscopy or flexible sigmoidoscopy):  Excessive amounts of blood in the stool  Significant tenderness or worsening of abdominal pains  Swelling of the abdomen that is new, acute  Fever of 100F or higher   For urgent or emergent issues, a gastroenterologist can be reached at any hour by calling [phone]. Do not use MyChart messaging for urgent concerns.    DIET:  We do recommend a small meal at first, but then you may proceed to your regular diet.  Drink plenty of fluids but you should avoid alcoholic beverages for 24 hours.  ACTIVITY:  You should plan to take it  easy for the rest of today and you should NOT DRIVE or use heavy machinery until tomorrow (because of the sedation medicines used during the test).    FOLLOW UP: Our staff will call the number listed on your records 24-72 hours following your procedure to check on you and address any questions or concerns that you may have regarding the information given to you following your procedure. If we do not reach you, we will leave a message.  We will attempt to reach you two times.  During this call, we will ask if you have developed any symptoms of COVID 19. If you develop any symptoms (ie: fever, flu-like symptoms, shortness of breath, cough etc.) before then, please call [phone].  If you test positive for Covid 19 in the 2 weeks post procedure, please call and report this information to [place].    If any biopsies were taken you will be contacted by phone or by letter within the next 1-3 weeks.  Please call us at [phone] if you have not heard about the biopsies in 3 weeks.    SIGNATURES/CONFIDENTIALITY: You and/or your care partner have signed paperwork which will be entered into your electronic medical record.  These signatures attest to the fact that that the information above on your After Visit Summary has been reviewed and is understood.  Full responsibility of the confidentiality of this discharge information lies with you and/or your care-partner.

## 2022-06-01 ENCOUNTER — Telehealth: Payer: Self-pay

## 2022-06-01 NOTE — Telephone Encounter (Signed)
  Follow up Call-     05/29/2022    8:53 AM  Call back number  Post procedure Call Back phone  # [phone]  Permission to leave phone message Yes     Patient questions:  Do you have a fever, pain , or abdominal swelling? No. Pain Score  0 *  Have you tolerated food without any problems? Yes.    Have you been able to return to your normal activities? Yes.    Do you have any questions about your discharge instructions: Diet   No. Medications  No. Follow up visit  No.  Do you have questions or concerns about your Care? No.  Actions: * If pain score is 4 or above: No action needed, pain <4.

## 2022-07-07 ENCOUNTER — Other Ambulatory Visit: Payer: Self-pay | Admitting: Cardiology

## 2022-07-07 ENCOUNTER — Other Ambulatory Visit: Payer: Self-pay | Admitting: Family Medicine

## 2022-07-09 ENCOUNTER — Encounter: Payer: Self-pay | Admitting: Cardiology

## 2022-07-10 ENCOUNTER — Ambulatory Visit: Payer: BC Managed Care – PPO | Admitting: Family Medicine

## 2022-07-20 ENCOUNTER — Telehealth: Payer: Self-pay | Admitting: *Deleted

## 2022-07-20 NOTE — Telephone Encounter (Signed)
Patient says he took the Home Sleep Test but failed it and his insurance will not pay for another test. He wants an Rx for a cpap machine so he can purchase one out of pocket but I explained to him that we need to know what setting to put him on and that comes from the sleep test. Will ask Dr Radford Pax for advisement.

## 2022-07-23 NOTE — Telephone Encounter (Signed)
Spoke with Dr. Bettina Gavia regarding this patient's sleep study and he does not feel that the patient needs to have another appointment with him since it is obvious that he has the symptoms of sleep apnea. Dr. Bettina Gavia has ordered an itamar sleep study for the patient since the study he had before cannot be found.

## 2022-07-25 NOTE — Telephone Encounter (Signed)
Per [facility] (DME) the office note on 08/11/21 is over 6 months old and the insurance will not accept it. The patient needs a recent office note stating that he never got set up with his Bipap. The home test he took can not be found because  he failed the test and he needed to retest but never he never followed up with [name] in the sleep lab.

## 2022-08-14 ENCOUNTER — Encounter: Payer: Self-pay | Admitting: Family Medicine

## 2022-08-14 ENCOUNTER — Ambulatory Visit: Payer: BC Managed Care – PPO | Admitting: Family Medicine

## 2022-08-14 VITALS — BP 142/100 | HR 89 | Temp 97.9°F | Resp 17 | Ht 69.0 in | Wt 279.4 lb

## 2022-08-14 DIAGNOSIS — I10 Essential (primary) hypertension: Secondary | ICD-10-CM

## 2022-08-14 DIAGNOSIS — H1033 Unspecified acute conjunctivitis, bilateral: Secondary | ICD-10-CM | POA: Diagnosis not present

## 2022-08-14 LAB — CBC WITH DIFFERENTIAL/PLATELET
Basophils Absolute: 0.1 10*3/uL (ref 0.0–0.1)
Basophils Relative: 0.7 % (ref 0.0–3.0)
Eosinophils Absolute: 0.4 10*3/uL (ref 0.0–0.7)
Eosinophils Relative: 4.8 % (ref 0.0–5.0)
HCT: 48.8 % (ref 39.0–52.0)
Hemoglobin: 16.5 g/dL (ref 13.0–17.0)
Lymphocytes Relative: 23.6 % (ref 12.0–46.0)
Lymphs Abs: 1.8 10*3/uL (ref 0.7–4.0)
MCHC: 33.7 g/dL (ref 30.0–36.0)
MCV: 101 fl — ABNORMAL HIGH (ref 78.0–100.0)
Monocytes Absolute: 1 10*3/uL (ref 0.1–1.0)
Monocytes Relative: 12.9 % — ABNORMAL HIGH (ref 3.0–12.0)
Neutro Abs: 4.5 10*3/uL (ref 1.4–7.7)
Neutrophils Relative %: 58 % (ref 43.0–77.0)
Platelets: 139 10*3/uL — ABNORMAL LOW (ref 150.0–400.0)
RBC: 4.84 Mil/uL (ref 4.22–5.81)
RDW: 14 % (ref 11.5–15.5)
WBC: 7.8 10*3/uL (ref 4.0–10.5)

## 2022-08-14 LAB — BASIC METABOLIC PANEL
BUN: 20 mg/dL (ref 6–23)
CO2: 30 mEq/L (ref 19–32)
Calcium: 9.5 mg/dL (ref 8.4–10.5)
Chloride: 102 mEq/L (ref 96–112)
Creatinine, Ser: 1.35 mg/dL (ref 0.40–1.50)
GFR: 56.53 mL/min — ABNORMAL LOW (ref >60.00)
Glucose, Bld: 99 mg/dL (ref 70–99)
Potassium: 4.2 mEq/L (ref 3.5–5.1)
Sodium: 140 mEq/L (ref 135–145)

## 2022-08-14 LAB — TSH: TSH: 2.16 u[IU]/mL (ref 0.35–5.50)

## 2022-08-14 LAB — HEPATIC FUNCTION PANEL
ALT: 29 U/L (ref 0–53)
AST: 28 U/L (ref 0–37)
Albumin: 4.2 g/dL (ref 3.5–5.2)
Alkaline Phosphatase: 151 U/L — ABNORMAL HIGH (ref 39–117)
Bilirubin, Direct: 0.2 mg/dL (ref 0.0–0.3)
Total Bilirubin: 0.7 mg/dL (ref 0.2–1.2)
Total Protein: 7.3 g/dL (ref 6.0–8.3)

## 2022-08-14 LAB — LIPID PANEL
Cholesterol: 154 mg/dL (ref 0–200)
HDL: 54.1 mg/dL (ref >39.00)
LDL Cholesterol: 80 mg/dL (ref 0–99)
NonHDL: 100.21
Total CHOL/HDL Ratio: 3
Triglycerides: 100 mg/dL (ref 0.0–149.0)
VLDL: 20 mg/dL (ref 0.0–40.0)

## 2022-08-14 MED ORDER — PATADAY 0.7 % OP SOLN: 1.0000 [drp] | Freq: Every day | OPHTHALMIC | 6 refills | Status: AC

## 2022-08-14 MED ORDER — POLYMYXIN B-TRIMETHOPRIM 10000-0.1 UNIT/ML-% OP SOLN: 2.0000 [drp] | Freq: Three times a day (TID) | OPHTHALMIC | 0 refills | Status: DC

## 2022-08-14 NOTE — Progress Notes (Signed)
   Subjective:    Patient ID: Keith Haynes, male    DOB: 05-28-1961, 61 y.o.   MRN: [id number]  HPI HTN- chronic problem, on Amlodipine '10mg'$  daily, Lasix '20mg'$  daily, Telmisartan HCTZ 80/12.'5mg'$  daily.  No CP, SOB, HAs, visual changes, edema.  Obesity- pt is up 10 lbs since last visit.  No regular exercise.  Not following particular diet.  Eye matting- pt reports both eyes have been red and matted for 'a couple months'.  No eye pain.  No itching.  Pt reports eyes will water throughout the day.  Not currently on allergy medication.  Did have abscessed tooth recently pulled.     Review of Systems For ROS see HPI     Objective:   Physical Exam Vitals reviewed.  Constitutional:      General: He is not in acute distress.    Appearance: Normal appearance. He is well-developed. He is obese.  HENT:     Head: Normocephalic and atraumatic.  Eyes:     General:        Right eye: Discharge (at medial corner) present.        Left eye: Discharge (at medial corner) present.    Extraocular Movements: Extraocular movements intact.     Pupils: Pupils are equal, round, and reactive to light.     Comments: Conjunctival injxn bilaterally  Neck:     Thyroid: No thyromegaly.  Cardiovascular:     Rate and Rhythm: Normal rate and regular rhythm.     Pulses: Normal pulses.     Heart sounds: Normal heart sounds. No murmur heard. Pulmonary:     Effort: Pulmonary effort is normal. No respiratory distress.     Breath sounds: Normal breath sounds.  Abdominal:     General: Bowel sounds are normal. There is no distension.     Palpations: Abdomen is soft.  Musculoskeletal:     Cervical back: Normal range of motion and neck supple.     Right lower leg: No edema.     Left lower leg: No edema.  Lymphadenopathy:     Cervical: No cervical adenopathy.  Skin:    General: Skin is warm and dry.  Neurological:     General: No focal deficit present.     Mental Status: He is alert and oriented to person, place,  and time.     Cranial Nerves: No cranial nerve deficit.  Psychiatric:        Mood and Affect: Mood normal.        Behavior: Behavior normal.           Assessment & Plan:   Conjunctivitis- new.  Bilateral.  Given recent abscessed tooth will start topical abx and then given duration of redness and matting will start daily Pataday for allergy component.  Pt expressed understanding and is in agreement w/ plan.

## 2022-08-14 NOTE — Patient Instructions (Addendum)
Schedule your complete physical in 6 months We'll notify you of your lab results and make any changes if needed Start the antibiotic eye drops as directed for 7 days After the antibiotic eye drops start the Olopatidine drops daily to help allergy symptoms Try and work on healthy diet and regular exercise to help w/ weight and blood pressure Call with any questions or concerns Stay Safe!  Stay Healthy!!!

## 2022-08-18 ENCOUNTER — Other Ambulatory Visit: Payer: Self-pay

## 2022-08-18 ENCOUNTER — Telehealth: Payer: Self-pay

## 2022-08-18 DIAGNOSIS — R748 Abnormal levels of other serum enzymes: Secondary | ICD-10-CM

## 2022-08-18 NOTE — Telephone Encounter (Signed)
Informed pt of lab results . Added on a GGT as well

## 2022-08-18 NOTE — Telephone Encounter (Signed)
-----  Message from Midge Minium, MD sent at 08/18/2022  7:31 AM EDT ----- Labs look good w/ exception of mildly elevated Alk Phos.  We will add a GGT (dx elevated alk phos) to assess if this is a liver issue or coming from somewhere else.  It's stable when compared to previous readings, I just want to make sure nothing has changed.

## 2022-08-28 NOTE — Telephone Encounter (Signed)
Discussed this patients case with Dr. Bettina Gavia and he asked that we schedule an appointment for the patient to see him to discuss the sleep study. Called the patient and he agreed with this plan and had no further questions.

## 2022-09-06 NOTE — Assessment & Plan Note (Signed)
Deteriorated.  Pt is up 10 lbs since last visit.  Not exercising or following a particular diet.  Stressed need for regular physical activity and low carb diet.  Check labs to risk stratify.  Will follow.

## 2022-09-06 NOTE — Assessment & Plan Note (Signed)
Chronic problem.  On Amlodipine '10mg'$  daily, Lasix '20mg'$  daily, Telmisartan HCTZ 80/12.'5mg'$  daily.  BP is mildly elevated today but pt has known element of white coat HTN.  Check labs due to diuretics and ARB use.  No anticipated med changes.  Will follow.

## 2022-09-11 ENCOUNTER — Ambulatory Visit: Payer: BC Managed Care – PPO | Admitting: Cardiology

## 2022-10-01 DIAGNOSIS — C44329 Squamous cell carcinoma of skin of other parts of face: Secondary | ICD-10-CM | POA: Diagnosis not present

## 2022-10-01 DIAGNOSIS — L570 Actinic keratosis: Secondary | ICD-10-CM | POA: Diagnosis not present

## 2022-10-05 ENCOUNTER — Ambulatory Visit: Payer: BC Managed Care – PPO | Attending: Cardiology | Admitting: Cardiology

## 2022-10-05 ENCOUNTER — Encounter: Payer: Self-pay | Admitting: Cardiology

## 2022-10-05 VITALS — BP 148/80 | HR 97 | Ht 69.0 in | Wt 278.6 lb

## 2022-10-05 DIAGNOSIS — M1A9XX0 Chronic gout, unspecified, without tophus (tophi): Secondary | ICD-10-CM

## 2022-10-05 DIAGNOSIS — I1A0 Resistant hypertension: Secondary | ICD-10-CM

## 2022-10-05 DIAGNOSIS — R0683 Snoring: Secondary | ICD-10-CM | POA: Diagnosis not present

## 2022-10-05 NOTE — Patient Instructions (Addendum)
Medication Instructions:  Your physician recommends that you continue on your current medications as directed. Please refer to the Current Medication list given to you today.  *If you need a refill on your cardiac medications before your next appointment, please call your pharmacy*   Lab Work: NONE If you have labs (blood work) drawn today and your tests are completely normal, you will receive your results only by: [facility] (if you have MyChart) OR A paper copy in the mail If you have any lab test that is abnormal or we need to change your treatment, we will call you to review the results.   Testing/Procedures:You have been asked to do a sleep study at home. You have been given instructions that you will need to carry out this study.   Follow-Up: At [facility], you and your health needs are our priority.  As part of our continuing mission to provide you with exceptional heart care, we have created designated Provider Care Teams.  These Care Teams include your primary Cardiologist (physician) and Advanced Practice Providers (APPs -  Physician Assistants and Nurse Practitioners) who all work together to provide you with the care you need, when you need it.  We recommend signing up for the patient portal called "MyChart".  Sign up information is provided on this After Visit Summary.  MyChart is used to connect with patients for Virtual Visits (Telemedicine).  Patients are able to view lab/test results, encounter notes, upcoming appointments, etc.  Non-urgent messages can be sent to your provider as well.   To learn more about what you can do with MyChart, go to [email or web address].    Your next appointment:   6 month(s)  The format for your next appointment:   In Person  Provider:   Shirlee More, MD     Other Instructions   Important Information About Sugar

## 2022-10-05 NOTE — Progress Notes (Signed)
Cardiology Office Note:    Date:  10/05/2022   ID:  [name], DOB [birth year], MRN [id number]  PCP:  Midge Minium, MD  Cardiologist:  Shirlee More, MD    Referring MD: Midge Minium, MD    ASSESSMENT:    1. Resistant hypertension   2. Chronic gout without tophus, unspecified cause, unspecified site   3. Loud snoring    PLAN:    In order of problems listed above:  Blood pressure is reasonably controlled for resistant hypertension on his current regimen including amlodipine furosemide telmisartan hydrochlorothiazide.  He is clearly needs a sleep study very obviously has obstructive sleep apnea and would benefit in many domains including blood pressure control and I have referred him for the study. He will continue his colchicine   Next appointment: 6 months   Medication Adjustments/Labs and Tests Ordered: Current medicines are reviewed at length with the patient today.  Concerns regarding medicines are outlined above.  Orders Placed This Encounter  Procedures   EKG 12-Lead   Itamar Sleep Study   No orders of the defined types were placed in this encounter.   Chief Complaint  Patient presents with   Follow-up   Hypertension    History of Present Illness:    Keith Haynes is a 61 y.o. male with a hx of resistant hypertension with CKD severe snoring and high probability of sleep apnea and gout which last seen on 12/26/2021 his most recent creatinine 1.14 GFR 73 cc/min..  Compliance with diet, lifestyle and medications: Yes  Some confusion about medications no longer taking clonidine or Inspra and I am not sure how that happened but we reconciled his medications Home blood pressure runs in the range of 140-150/90-95 Repeat me office large cuff by me 148/80 Still waiting to have a repeat sleep study No edema shortness of breath chest pain palpitation or syncope Past Medical History:  Diagnosis Date   Basal cell carcinoma 10/03/2014   right lower  eyelid-MOHS   Gout    History of chicken pox    Hypertension    Melanoma ([place]) 04/17/2014   left midback-exc.   SCC (squamous cell carcinoma) 09/13/2014   right hand-exc.   SCC (squamous cell carcinoma) 08/07/2013   left upper forehead-tx p bx    Past Surgical History:  Procedure Laterality Date   CHOLECYSTECTOMY  12/14/1996   COLONOSCOPY  2012   NORMAL-per pt    Current Medications: Current Meds  Medication Sig   allopurinol (ZYLOPRIM) 300 MG tablet TAKE 1 TABLET BY MOUTH EVERY DAY   amLODipine (NORVASC) 10 MG tablet Take 10 mg by mouth daily.   Ascorbic Acid (VITAMIN C PO) Take by mouth.   colchicine 0.6 MG tablet TAKE 1 TABLET (0.6 MG TOTAL) BY MOUTH 2 (TWO) TIMES DAILY.   furosemide (LASIX) 20 MG tablet Take 1 tablet (20 mg total) by mouth 3 (three) times a week.   Olopatadine HCl (PATADAY) 0.7 % SOLN Apply 1 drop to eye daily.   sildenafil (VIAGRA) 100 MG tablet TAKE 1/2 TO 1 TABLET (50-100 MG TOTAL) BY MOUTH DAILY AS NEEDED FOR ERECTILE DYSFUNCTION.   telmisartan-hydrochlorothiazide (MICARDIS HCT) 80-12.5 MG tablet TAKE 1 TABLET BY MOUTH EVERY DAY   trimethoprim-polymyxin b (POLYTRIM) ophthalmic solution Place 2 drops into both eyes 3 (three) times daily. Use no longer than 7 days   VITAMIN D PO Take by mouth.   zinc gluconate 50 MG tablet Take 50 mg by mouth daily.     Allergies:  Patient has no known allergies.   Social History   Socioeconomic History   Marital status: Married    Spouse name: Not on file   Number of children: Not on file   Years of education: Not on file   Highest education level: Not on file  Occupational History   Not on file  Tobacco Use   Smoking status: Never    Passive exposure: Never   Smokeless tobacco: Never  Vaping Use   Vaping Use: Never used  Substance and Sexual Activity   Alcohol use: Yes    Alcohol/week: 21.0 standard drinks of alcohol    Types: 21 Cans of beer per week    Comment: 3 beers a day per pt   Drug use: No    Sexual activity: Not Currently  Other Topics Concern   Not on file  Social History Narrative   Not on file   Social Determinants of Health   Financial Resource Strain: Not on file  Food Insecurity: Not on file  Transportation Needs: Not on file  Physical Activity: Not on file  Stress: Not on file  Social Connections: Not on file     Family History: The patient's family history includes Diabetes in his father; Hypertension in his mother; Urinary tract infection (age of onset: 42) in his father. There is no history of Colon cancer, Esophageal cancer, Rectal cancer, or Stomach cancer. ROS:   Please see the history of present illness.    All other systems reviewed and are negative.  EKGs/Labs/Other Studies Reviewed:    The following studies were reviewed today:  EKG:  EKG ordered today and personally reviewed.  The ekg ordered today demonstrates sinus rhythm normal EKG  Recent Labs: 08/14/2022: ALT 29; BUN 20; Creatinine, Ser 1.35; Hemoglobin 16.5; Platelets 139.0; Potassium 4.2; Sodium 140; TSH 2.16  Recent Lipid Panel    Component Value Date/Time   CHOL 154 08/14/2022 0822   CHOL 165 12/26/2021 0921   TRIG 100.0 08/14/2022 0822   HDL 54.10 08/14/2022 0822   HDL 54 12/26/2021 0921   CHOLHDL 3 08/14/2022 0822   VLDL 20.0 08/14/2022 0822   LDLCALC 80 08/14/2022 0822   LDLCALC 93 12/26/2021 0921   LDLCALC 110 (H) 12/22/2019 0848    Physical Exam:    VS:  BP (!) 148/80 (Cuff Size: Large)   Pulse 97   Ht '5\' 9"'$  (1.753 m)   Wt 278 lb 9.6 oz (126.4 kg)   SpO2 95%   BMI 41.14 kg/m     Wt Readings from Last 3 Encounters:  10/05/22 278 lb 9.6 oz (126.4 kg)  08/14/22 279 lb 6 oz (126.7 kg)  05/29/22 275 lb (124.7 kg)     GEN:  Well nourished, well developed in no acute distress HEENT: Normal NECK: No JVD; No carotid bruits LYMPHATICS: No lymphadenopathy CARDIAC: RRR, no murmurs, rubs, gallops RESPIRATORY:  Clear to auscultation without rales, wheezing or rhonchi   ABDOMEN: Soft, non-tender, non-distended MUSCULOSKELETAL:  No edema; No deformity  SKIN: Warm and dry NEUROLOGIC:  Alert and oriented x 3 PSYCHIATRIC:  Normal affect    Signed, Shirlee More, MD  10/05/2022 3:43 PM    [facility]

## 2022-10-06 ENCOUNTER — Other Ambulatory Visit: Payer: Self-pay | Admitting: Family Medicine

## 2022-10-06 ENCOUNTER — Other Ambulatory Visit: Payer: Self-pay

## 2022-10-06 ENCOUNTER — Other Ambulatory Visit: Payer: Self-pay | Admitting: Cardiology

## 2022-10-06 DIAGNOSIS — N529 Male erectile dysfunction, unspecified: Secondary | ICD-10-CM

## 2022-10-06 MED ORDER — ALLOPURINOL 300 MG PO TABS: 300.0000 mg | ORAL_TABLET | Freq: Every day | ORAL | 1 refills | Status: DC

## 2022-10-06 MED ORDER — TELMISARTAN-HCTZ 80-12.5 MG PO TABS: 1.0000 | ORAL_TABLET | Freq: Every day | ORAL | 1 refills | Status: DC

## 2022-10-07 ENCOUNTER — Other Ambulatory Visit: Payer: Self-pay | Admitting: Cardiology

## 2022-10-26 ENCOUNTER — Telehealth: Payer: Self-pay | Admitting: Cardiology

## 2022-10-26 NOTE — Telephone Encounter (Signed)
Pt is requesting call back to discuss the Itamar Sleep Study. He says he knows he was denied, but he would like to discuss price and to see if he can pay for it out of pocket. Please advise.

## 2022-10-26 NOTE — Telephone Encounter (Signed)
Advised that the itamar is 700.00. Advised to call insurance and discuss. Pt verbalized understanding and had no additional questions.

## 2022-10-27 ENCOUNTER — Telehealth: Payer: Self-pay | Admitting: Cardiology

## 2022-10-27 ENCOUNTER — Telehealth: Payer: Self-pay

## 2022-10-27 NOTE — Telephone Encounter (Signed)
Okay to referr for sleep study?

## 2022-10-27 NOTE — Telephone Encounter (Signed)
Noted  

## 2022-10-27 NOTE — Telephone Encounter (Signed)
Patient called and was told to request a home sleep study order, patient does not know where this is to be sent, asked he do some digging information and find out where this is to be sent

## 2022-10-27 NOTE — Telephone Encounter (Signed)
Left vm for pt to callback 

## 2022-10-27 NOTE — Telephone Encounter (Signed)
Pt called back.Pt wants his referral sent to [facility] at [address] [place] [address] [address] Telephone number is [phone].

## 2022-10-27 NOTE — Telephone Encounter (Signed)
New Message:     Patient said he just saw Dr Bettina Gavia. He would like for the nurse to please give him a call.

## 2022-10-28 ENCOUNTER — Telehealth: Payer: Self-pay | Admitting: *Deleted

## 2022-10-28 ENCOUNTER — Other Ambulatory Visit: Payer: Self-pay

## 2022-10-28 DIAGNOSIS — T732XXA Exhaustion due to exposure, initial encounter: Secondary | ICD-10-CM

## 2022-10-28 NOTE — Telephone Encounter (Signed)
Spoke with pt about sleep study not being covered. He spoke extensively with [employer] about why study is not covered. They told him that Dr. Bettina Gavia is in network but that the building is not. Insurance sent pt a list of facilities that are covered by his insurance and he is going to get it done through one of these other facilities and let us know the results.

## 2022-10-28 NOTE — Telephone Encounter (Signed)
Pt states that the Dr. Birdie Riddle has placed a referral for the sleep study since we are out of network.

## 2022-10-28 NOTE — Telephone Encounter (Signed)
Ok to refer for sleep study to the provider requested (although, cardiology was recommending a specific study that was rejected by insurance).  He may need to reach out to them to make sure he is getting what they wanted

## 2022-10-28 NOTE — Telephone Encounter (Signed)
I have placed the referral and I informed pt . He states that his insurance will only pay for an at home sleep study and I advised him to call cardiology as well

## 2022-10-29 NOTE — Telephone Encounter (Signed)
Thank you . That was the information the pt gave [place] as to where and who he wanted it to go to he states his insurance will only pay for a at home sleep study

## 2022-10-29 NOTE — Telephone Encounter (Signed)
An order was placed for a home sleep study.  Per the notes below pt wants to go to [employer] [facility] who is listed as a ENT. He is under the [facility] health ENT specialist. Which is no longer open.   I have LM asking pt to provide more information as I'm not sure what we need to do.

## 2022-11-13 ENCOUNTER — Telehealth: Payer: Self-pay | Admitting: Family Medicine

## 2022-11-13 NOTE — Telephone Encounter (Signed)
Patient called stating that his insurance has approved his home sleep study and he was calling to let Danae Chen know and see what needs to be done next.

## 2022-11-13 NOTE — Telephone Encounter (Signed)
Patient called stating that he thinks the colchicine isn't working anymore. He has at home uric acid level test and the level doesn't seem to go down despite taking the medication. He wanted to know if there was anything else he could try.

## 2022-11-15 NOTE — Telephone Encounter (Signed)
I would rather have an office visit to check his true uric acid level and discuss possible next steps.  I don't necessarily trust all home tests and would hate to make changes based on bad information

## 2022-11-16 NOTE — Telephone Encounter (Signed)
Please schedule an appointment.

## 2022-11-18 NOTE — Telephone Encounter (Signed)
Pt has been  reached to get this scheduled.  Pt advised they would need to call back to get it scheduled.

## 2022-11-19 ENCOUNTER — Encounter: Payer: Self-pay | Admitting: Family Medicine

## 2022-11-19 ENCOUNTER — Telehealth: Payer: Self-pay

## 2022-11-19 ENCOUNTER — Ambulatory Visit: Payer: BC Managed Care – PPO | Admitting: Family Medicine

## 2022-11-19 VITALS — BP 130/84 | HR 95 | Temp 97.8°F | Resp 17 | Ht 69.0 in | Wt 275.1 lb

## 2022-11-19 DIAGNOSIS — M1A09X0 Idiopathic chronic gout, multiple sites, without tophus (tophi): Secondary | ICD-10-CM

## 2022-11-19 LAB — URIC ACID: Uric Acid, Serum: 5.9 mg/dL (ref 4.0–7.8)

## 2022-11-19 MED ORDER — TELMISARTAN 80 MG PO TABS: 80.0000 mg | ORAL_TABLET | Freq: Every day | ORAL | 1 refills | Status: DC

## 2022-11-19 NOTE — Patient Instructions (Signed)
Follow up in 1 month to recheck BP and gout We'll notify you of your lab results and make any changes if needed STOP the combo pill w/ the HCTZ START the plain Telmisartan CONTINUE the Amlodipine Continue to drink LOTS of water Call with any questions or concerns Stay Safe!  Stay Healthy! Happy Holidays!!!

## 2022-11-19 NOTE — Telephone Encounter (Signed)
-----   Message from Midge Minium, MD sent at 11/19/2022  4:12 PM EST ----- Your uric acid is in normal range.  This is great news!  We'll stop the HCTZ like we discussed at our visit and hopefully that will help w/ the pain and stiffness you are having.  If not, we'll refer you to Ortho or Rheumatology for a complete evaluation

## 2022-11-19 NOTE — Progress Notes (Signed)
   Subjective:    Patient ID: Isadore Palecek, male    DOB: Aug 28, 1961, 61 y.o.   MRN: [id number]  HPI Gout- ongoing issue for pt.  He is currently on Allopurinol '300mg'$  daily and Colchicine 0.'6mg'$  BID.  Pt feels that either the medication is not as strong since switching generic brands or 'i'm immune'.  Pt reports bilateral ankle pain and stiffness x6 months.  Pt had to call of work on Monday and Tuesday bc he strained his arm and 'gout just attacks inflammation'.  Pt denies redness, warmth, and swelling of ankles.     Review of Systems For ROS see HPI     Objective:   Physical Exam Vitals reviewed.  Constitutional:      General: He is not in acute distress.    Appearance: Normal appearance. He is obese. He is not ill-appearing.  HENT:     Head: Normocephalic and atraumatic.  Cardiovascular:     Rate and Rhythm: Normal rate and regular rhythm.  Pulmonary:     Effort: Pulmonary effort is normal. No respiratory distress.     Breath sounds: Normal breath sounds.  Musculoskeletal:     Right lower leg: No edema.     Left lower leg: No edema.  Skin:    General: Skin is warm and dry.  Neurological:     General: No focal deficit present.     Mental Status: He is alert and oriented to person, place, and time.        Assessment & Plan:

## 2022-11-19 NOTE — Telephone Encounter (Signed)
I spoke to the pt and informed him of his lab results

## 2022-11-22 NOTE — Assessment & Plan Note (Signed)
Deteriorated.  Pt reports bilateral ankle pain and stiffness x6 months.  He is currently on Allopurinol '300mg'$  daily and Cholchicine 0.6 BID w/o relief.  Denies redness, warmth, swelling of ankles but chronic, worsening pain.  He does relay that years ago he had something similar happen when he was on 'a water pill' and it got to the point he could barely walk.  All sxs resolved once he stopped the medication.  Will stop HCTZ, get a baseline uric acid level today, and adjust Allopurinol as needed if sxs don't improve.  Pt expressed understanding and is in agreement w/ plan.

## 2022-11-24 ENCOUNTER — Ambulatory Visit: Payer: BC Managed Care – PPO

## 2022-11-24 DIAGNOSIS — G4733 Obstructive sleep apnea (adult) (pediatric): Secondary | ICD-10-CM | POA: Diagnosis not present

## 2022-11-27 DIAGNOSIS — G4733 Obstructive sleep apnea (adult) (pediatric): Secondary | ICD-10-CM | POA: Diagnosis not present

## 2022-12-15 ENCOUNTER — Telehealth: Payer: Self-pay | Admitting: *Deleted

## 2022-12-15 DIAGNOSIS — I1A0 Resistant hypertension: Secondary | ICD-10-CM

## 2022-12-15 DIAGNOSIS — R0683 Snoring: Secondary | ICD-10-CM

## 2022-12-15 NOTE — Telephone Encounter (Signed)
I received a phone call few weeks ago at sleep ctr and she said i need to get a referral for a home sleep study again. Can you do this for me.  If he has a smartphone get an Itamar sleep study - if not then send to sleep lab for PSG   Yes he has to repeat the study.    My nurse Gae Bon is checking into all of this - I just wanted to keep Keith Haynes in the loop    [place] I noticed this message came into the box yesterday.  Keith Haynes is on vacation so I guess this is something that will have to be discussed when he is back.  Thank you and have a nice day    Keith Haynes  Please let me know what is going on with this patient.  He apparently has an HST and was told he failed it>>does that mean the data was no good and needs to be repeated.  I cannot order a CPAP without a sleep study report  [name] said the patient failed the sleep study so it was not documented. He was suppose to come back and repeat the home test but never did. Patients DME says he only needs a current office note per his insurance.    Gae Bon I need to know where the sleep study was done and we need a copy of it    Patient says he took the Home Sleep Test but failed it and his insurance will not pay for another test. He wants an Rx for a cpap machine so he can purchase one out of pocket but I explained to him that we need to know what setting to put him on and that comes from the sleep test. There is not a sleep test on file.    Gae Bon and Mariann Laster  I cannot find any evidence of a prior sleep study on this patient either in lab or HST - can you find out where this patient has his study done    The only home sleep study that I can see was done on  07/25/21 and he stated that the tech who he brought the home sleep study back to at [facility] told him that he had failed severely.    When was sleep study done - I cannot find a report in EPIC    Dr. Bettina Gavia asked that I forward this to you. This patient had an order for a sleep study to  be done at [facility] but his insurance would not pay for it. He then had an itamar sleep study and the patient stated that he failed it. Now he is asking for a prescription for a CPAP. Dr. Bettina Gavia thinks that there is a process to titrate the machine to the patient, ect... that must be performed  first before a prescription can be given for a CPAP machine. Please advise.    Yes, where did he have his sleep study notes generally who orders it as she is not ordering the machine however he need to follow-up and do downloads did we do our practice?    Does he need to get a prescription for the CPAP machine from you or someone else? Please advise    I need to get a cpac machine and I am willing to pay for it out of pocket. I did the test at home and sent it off and was diagnosed with severe sleep apnea as you probably  know. When I go to order one it says I need a prescription. is this something you can help me with ?

## 2022-12-15 NOTE — Telephone Encounter (Signed)
Npsg order has been placed.

## 2022-12-23 ENCOUNTER — Encounter: Payer: Self-pay | Admitting: Cardiology

## 2022-12-25 ENCOUNTER — Telehealth: Payer: Self-pay

## 2022-12-25 NOTE — Telephone Encounter (Signed)
Received the message below from Sydell Axon:  "[facility] is not part of our sleep team and he read the study. Patient needs to follow up with Sood at pulmonary."  Called the patient and informed him that since Dr. Halford Chessman had the sleep study done, that he would have to follow up with Dr. Halford Chessman for the next steps.  Patient had no further questions at this time.

## 2022-12-28 ENCOUNTER — Telehealth: Payer: Self-pay | Admitting: Pulmonary Disease

## 2022-12-28 NOTE — Telephone Encounter (Signed)
Called patient and he states he got a HST that was ordered by his PCP due to cardiology wanted a sleep study. Patient states he had it done but his PCP can not read it due to not a sleep doc. Patient is asking if we are able to interp HST.  Please advise sir

## 2022-12-30 NOTE — Telephone Encounter (Signed)
The study shows severe sleep apnea with hypoxemia.  The study was ordered by Dr. Birdie Riddle.  He has not been seen in pulmonary office before.  WIll include Dr. Birdie Riddle in message and defer to her whether he needs pulmonary/sleep medicine consultation.

## 2022-12-30 NOTE — Telephone Encounter (Signed)
Pt absolutely needs pulmonary referral for OSA (obstructive sleep apnea) so that they can get him appropriately treated.

## 2022-12-31 ENCOUNTER — Other Ambulatory Visit: Payer: Self-pay | Admitting: Cardiology

## 2022-12-31 ENCOUNTER — Encounter: Payer: Self-pay | Admitting: Family Medicine

## 2022-12-31 ENCOUNTER — Ambulatory Visit: Payer: BC Managed Care – PPO | Admitting: Family Medicine

## 2022-12-31 ENCOUNTER — Ambulatory Visit (INDEPENDENT_AMBULATORY_CARE_PROVIDER_SITE_OTHER)
Admission: RE | Admit: 2022-12-31 | Discharge: 2022-12-31 | Disposition: A | Discharge status: Home / Self Care | Payer: BC Managed Care – PPO | Source: Ambulatory Visit | Attending: Family Medicine | Admitting: Family Medicine

## 2022-12-31 VITALS — BP 132/82 | HR 99 | Temp 97.8°F | Resp 17 | Ht 69.0 in | Wt 280.4 lb

## 2022-12-31 DIAGNOSIS — M2550 Pain in unspecified joint: Secondary | ICD-10-CM | POA: Insufficient documentation

## 2022-12-31 DIAGNOSIS — R600 Localized edema: Secondary | ICD-10-CM | POA: Diagnosis not present

## 2022-12-31 DIAGNOSIS — M1A09X0 Idiopathic chronic gout, multiple sites, without tophus (tophi): Secondary | ICD-10-CM

## 2022-12-31 DIAGNOSIS — I10 Essential (primary) hypertension: Secondary | ICD-10-CM

## 2022-12-31 LAB — SEDIMENTATION RATE: Sed Rate: 17 mm/hr (ref 0–20)

## 2022-12-31 NOTE — Patient Instructions (Signed)
Follow up as needed or as scheduled We'll notify you of your lab results and make any changes if needed Go to [place] to get your xrays Call with any questions or concerns Stay Safe!  Stay Healthy! Hang in there!!!

## 2022-12-31 NOTE — Progress Notes (Signed)
   Subjective:    Patient ID: Keith Haynes, male    DOB: 1961-01-10, 62 y.o.   MRN: [id number]  HPI HTN- at last visit we dropped the HCTZ component of his combo medication and he continued the Telmisartan '80mg'$  daily.  Also on Amlodipine '10mg'$  daily.  Gout- at last visit we stopped his HCTZ.  Currently on Allopurinol '300mg'$  daily and Colchicine 0.'6mg'$  BID.  Continues to have pain in ankles, knees, and elbows.  Pt states they aren't red or swollen.  'they're just stiff'.  Reports that he will occasionally double up on colchicine and sxs improve.     Review of Systems For ROS see HPI     Objective:   Physical Exam Vitals reviewed.  Constitutional:      General: He is not in acute distress.    Appearance: Normal appearance. He is well-developed. He is not ill-appearing.  HENT:     Head: Normocephalic and atraumatic.  Eyes:     Extraocular Movements: Extraocular movements intact.     Conjunctiva/sclera: Conjunctivae normal.     Pupils: Pupils are equal, round, and reactive to light.  Neck:     Thyroid: No thyromegaly.  Cardiovascular:     Rate and Rhythm: Normal rate and regular rhythm.     Pulses: Normal pulses.     Heart sounds: Normal heart sounds. No murmur heard. Pulmonary:     Effort: Pulmonary effort is normal. No respiratory distress.     Breath sounds: Normal breath sounds.  Abdominal:     General: Bowel sounds are normal. There is no distension.     Palpations: Abdomen is soft.  Musculoskeletal:     Cervical back: Normal range of motion and neck supple.     Right lower leg: No edema.     Left lower leg: No edema.  Lymphadenopathy:     Cervical: No cervical adenopathy.  Skin:    General: Skin is warm and dry.  Neurological:     General: No focal deficit present.     Mental Status: He is alert and oriented to person, place, and time.     Cranial Nerves: No cranial nerve deficit.  Psychiatric:        Mood and Affect: Mood normal.        Behavior: Behavior normal.            Assessment & Plan:

## 2023-01-01 LAB — ANA: Anti Nuclear Antibody (ANA): NEGATIVE

## 2023-01-01 LAB — RHEUMATOID FACTOR: Rheumatoid fact SerPl-aCnc: <14 IU/mL (ref <14)

## 2023-01-01 NOTE — Telephone Encounter (Signed)
Patient is schedule for sleep consultation 01/21/2023 at 2pm with Dr. Annamaria Boots. Patient aware.

## 2023-01-01 NOTE — Assessment & Plan Note (Signed)
BP is well controlled today- especially in the setting of stopping HCTZ.  Continue Telmisartan '80mg'$  daily and Amlodipine '10mg'$  daily.  No changes at this time

## 2023-01-01 NOTE — Assessment & Plan Note (Signed)
Unfortunately pt's joint pain did not improve w/ stopping HCTZ.  Last uric acid level was WNL.  On Allopurinol '300mg'$  daily and Colchicine 0.'6mg'$  BID.  At times he will double his Colchicine dose and finds that this is temporarily helpful.  Joints do not get red or warm.  Discussed that his joint pain may not be gout related and may be arthritis or some rheumatologic condition.  Will not adjust gout meds at this time but start a polyarthralgia workup.

## 2023-01-01 NOTE — Assessment & Plan Note (Signed)
New.  Pt has always attributed this to his gout but the more he describes his sxs, I'm not convinced that's what's going on.  He has multiple joint pain but denies redness, warmth, or swelling.  This is atypical for gout and more consistent w/ arthritis or autoimmune process.  Will get xrays of ankle as that is his most painful joint and check labs to assess for rheumatologic process.  Pt expressed understanding and is in agreement w/ plan.

## 2023-01-06 ENCOUNTER — Telehealth: Payer: Self-pay

## 2023-01-06 NOTE — Telephone Encounter (Signed)
I informed pt of Xray results and he states he will think about the referral and get back in touch with [place] on that matter

## 2023-01-06 NOTE — Telephone Encounter (Signed)
-----  Message from Midge Minium, MD sent at 01/06/2023  7:31 AM EST ----- Your xray shows degenerative changes (arthritis) and evidence of prior injury/trauma.  If you are willing, I would like to send you to a foot and ankle specialist- Dr Lucia Gaskins at [facility].  Please let me know if it's ok to proceed w/ the referral.

## 2023-01-08 ENCOUNTER — Encounter: Payer: BC Managed Care – PPO | Admitting: Family Medicine

## 2023-01-20 NOTE — Progress Notes (Unsigned)
2//8/24- 61 yoM never smoker for sleep evaluation courtesy of Dr Birdie Riddle with concern of OSA with nocturnal hypoxemia. Medical problem list includes HTN, gout, hyperlipidemia, morbid obesity, Hx melanoma, polyarthralgia, HST 11/24/22- AHI 78.3/ hr, desaturation to 49%/ average 84%, body weight 275 lbs. Epworth score-16 Body weight today-279 lbs Covid vax-none Flu vax-  none                                   -----Had sleep study in December results were never discussed. Never had a cpap machine    He and his wife have slept separately for 30 years because of his loud snoring.  In the last year or 2 he is noticed much more daytime tiredness, has become less active, and is put on 40 pounds. No ENT surgery.  Always stuffy left nostril.  No sleep medications and little caffeine.  Up between 1 and 3 times at night for bathroom. No family history of sleep apnea.  Cardiology follows for hypertension and he denies cardiac or lung disease.  No routine cough or wheeze. We discussed results of his sleep study and implications of hypoxia, possibly reflecting obesity hypoventilation at night.  We discussed medical issues of sleep apnea, treatment choices, safe driving responsibility.  He chooses to start with CPAP.  Current Outpatient Medications on File Prior to Visit  Medication Sig Dispense Refill   allopurinol (ZYLOPRIM) 300 MG tablet Take 1 tablet (300 mg total) by mouth daily. 90 tablet 1   amLODipine (NORVASC) 10 MG tablet Take 1 tablet (10 mg total) by mouth daily. 90 tablet 3   Ascorbic Acid (VITAMIN C PO) Take by mouth.     colchicine 0.6 MG tablet TAKE 1 TABLET BY MOUTH 2 TIMES DAILY. 180 tablet 1   Olopatadine HCl (PATADAY) 0.7 % SOLN Apply 1 drop to eye daily. 2.5 mL 6   sildenafil (VIAGRA) 100 MG tablet TAKE 1/2 TO 1 TABLET (50-100 MG TOTAL) BY MOUTH DAILY AS NEEDED FOR ERECTILE DYSFUNCTION. 4 tablet 27   telmisartan (MICARDIS) 80 MG tablet Take 1 tablet (80 mg total) by mouth daily. 90 tablet 1    VITAMIN D PO Take by mouth.     zinc gluconate 50 MG tablet Take 50 mg by mouth daily.     furosemide (LASIX) 20 MG tablet Take 1 tablet (20 mg total) by mouth 3 (three) times a week. (Patient not taking: Reported on 01/21/2023) 36 tablet 3   No current facility-administered medications on file prior to visit.   Past Medical History:  Diagnosis Date   Basal cell carcinoma 10/03/2014   right lower eyelid-MOHS   Gout    History of chicken pox    Hypertension    Melanoma ([place]) 04/17/2014   left midback-exc.   SCC (squamous cell carcinoma) 09/13/2014   right hand-exc.   SCC (squamous cell carcinoma) 08/07/2013   left upper forehead-tx p bx   Past Surgical History:  Procedure Laterality Date   CHOLECYSTECTOMY  12/14/1996   COLONOSCOPY  2012   NORMAL-per pt   Family History  Problem Relation Age of Onset   Hypertension Mother    Diabetes Father    Urinary tract infection Father 8       Septic from UTI   Colon cancer Neg Hx    Esophageal cancer Neg Hx    Rectal cancer Neg Hx    Stomach cancer Neg Hx  Social History   Socioeconomic History   Marital status: Married    Spouse name: Not on file   Number of children: Not on file   Years of education: Not on file   Highest education level: Not on file  Occupational History   Not on file  Tobacco Use   Smoking status: Never    Passive exposure: Never   Smokeless tobacco: Never  Vaping Use   Vaping Use: Never used  Substance and Sexual Activity   Alcohol use: Yes    Alcohol/week: 21.0 standard drinks of alcohol    Types: 21 Cans of beer per week    Comment: 3 beers a day per pt   Drug use: No   Sexual activity: Not Currently  Other Topics Concern   Not on file  Social History Narrative   Not on file   Social Determinants of Health   Financial Resource Strain: Not on file  Food Insecurity: Not on file  Transportation Needs: Not on file  Physical Activity: Not on file  Stress: Not on file  Social  Connections: Not on file  Intimate Partner Violence: Not on file   ROS-see HPI   + = positive Constitutional:    weight loss, night sweats, fevers, chills, +fatigue, lassitude. HEENT:    headaches, difficulty swallowing,+ tooth/dental problems, sore throat,       sneezing, itching, ear ache, nasal congestion, post nasal drip, snoring CV:    chest pain, orthopnea, PND, swelling in lower extremities, anasarca,                                   dizziness, palpitations Resp:   shortness of breath with exertion or at rest.                productive cough,   non-productive cough, coughing up of blood.              change in color of mucus.  wheezing.   Skin:    rash or lesions. GI:  No-   heartburn, indigestion, abdominal pain, nausea, vomiting, diarrhea,                 change in bowel habits, loss of appetite GU: dysuria, change in color of urine, no urgency or frequency.   flank pain. MS:  + joint pain, stiffness, decreased range of motion, back pain. Neuro-     nothing unusual Psych:  change in mood or affect.  depression or anxiety.   memory loss.  OBJ- Physical Exam General- Alert, Oriented, Affect-appropriate, Distress- none acute, + obese Skin- rash-none, lesions- none, excoriation- none Lymphadenopathy- none Head- atraumatic            Eyes- Gross vision intact, PERRLA, conjunctivae and secretions clear            Ears- Hearing, canals-normal            Nose- Clear, mucus, polyps, erosion, perforation + feels stuffy L nostril            Throat- Mallampati IV , mucosa clear , drainage- none, tonsils- atrophic, +teeth Neck- flexible , trachea midline, no stridor , thyroid nl, carotid no bruit, +thick neck, Chest - symmetrical excursion , unlabored           Heart/CV- RRR , no murmur , no gallop  , no rub, nl s1 s2                           -  JVD- none , edema- none, stasis changes- none, varices- none           Lung- clear to P&A, wheeze- none, cough- none , dullness-none, rub-  none           Chest wall-  Abd-  Br/ Gen/ Rectal- Not done, not indicated Extrem- cyanosis- none, clubbing, none, atrophy- none, strength- nl Neuro- grossly intact to observation

## 2023-01-21 ENCOUNTER — Ambulatory Visit (INDEPENDENT_AMBULATORY_CARE_PROVIDER_SITE_OTHER): Payer: BC Managed Care – PPO | Admitting: Internal Medicine

## 2023-01-21 ENCOUNTER — Ambulatory Visit (INDEPENDENT_AMBULATORY_CARE_PROVIDER_SITE_OTHER): Payer: BC Managed Care – PPO

## 2023-01-21 ENCOUNTER — Encounter: Payer: Self-pay | Admitting: Internal Medicine

## 2023-01-21 VITALS — BP 138/82 | HR 91 | Ht 69.0 in | Wt 279.6 lb

## 2023-01-21 DIAGNOSIS — G4733 Obstructive sleep apnea (adult) (pediatric): Secondary | ICD-10-CM

## 2023-01-21 DIAGNOSIS — G4734 Idiopathic sleep related nonobstructive alveolar hypoventilation: Secondary | ICD-10-CM

## 2023-01-21 DIAGNOSIS — R0902 Hypoxemia: Secondary | ICD-10-CM | POA: Diagnosis not present

## 2023-01-21 NOTE — Assessment & Plan Note (Signed)
Probable obesity hypoventilation syndrome.  No history of lung disease and never smoked. Plan-CXR, PFT.  Anticipate overnight oximetry on CPAP.

## 2023-01-21 NOTE — Assessment & Plan Note (Signed)
Severe obstructive sleep apnea corresponding to 40 pound reported weight loss over the last year or 2. Plan-we are going to start CPAP with Auto[employer].  Once he is settled on this we will arrange overnight oximetry on CPAP

## 2023-01-21 NOTE — Patient Instructions (Signed)
Order- CXR   dx Nocturnal Hypoxemia  Order- schedule PFT   dx Nocturnal Hypoxemia  Order- new DME, new CPAP auto 5-20, mask of choice, humidifier, supplies, AirView/card  Please call as needed

## 2023-01-30 DIAGNOSIS — L570 Actinic keratosis: Secondary | ICD-10-CM | POA: Diagnosis not present

## 2023-01-30 DIAGNOSIS — L821 Other seborrheic keratosis: Secondary | ICD-10-CM | POA: Diagnosis not present

## 2023-01-30 DIAGNOSIS — C44329 Squamous cell carcinoma of skin of other parts of face: Secondary | ICD-10-CM | POA: Diagnosis not present

## 2023-01-30 DIAGNOSIS — Z85820 Personal history of malignant melanoma of skin: Secondary | ICD-10-CM | POA: Diagnosis not present

## 2023-02-09 DIAGNOSIS — G4733 Obstructive sleep apnea (adult) (pediatric): Secondary | ICD-10-CM | POA: Diagnosis not present

## 2023-02-19 ENCOUNTER — Encounter: Payer: BC Managed Care – PPO | Admitting: Family Medicine

## 2023-02-20 ENCOUNTER — Other Ambulatory Visit: Payer: Self-pay | Admitting: Cardiology

## 2023-02-20 ENCOUNTER — Other Ambulatory Visit: Payer: Self-pay | Admitting: Family Medicine

## 2023-03-10 DIAGNOSIS — G4733 Obstructive sleep apnea (adult) (pediatric): Secondary | ICD-10-CM | POA: Diagnosis not present

## 2023-03-17 ENCOUNTER — Encounter: Payer: BC Managed Care – PPO | Admitting: Family Medicine

## 2023-04-04 ENCOUNTER — Encounter: Payer: Self-pay | Admitting: Internal Medicine

## 2023-04-04 DIAGNOSIS — G4733 Obstructive sleep apnea (adult) (pediatric): Secondary | ICD-10-CM

## 2023-04-05 NOTE — Telephone Encounter (Signed)
Sure- please give him 2 copies of his current CPAP prescription, including mask of choice, supplies, humidifier and download cards.

## 2023-04-05 NOTE — Telephone Encounter (Signed)
Received a message from patient asking for an order for 2 more cpap machines. He would like [place] have [place] so he does not have to pack up his cpap machine from home.   Dr. Maple Hudson, please advise if you are ok with placing these orders. He is aware that insurance will not pay for these additional machines.

## 2023-04-10 DIAGNOSIS — G4733 Obstructive sleep apnea (adult) (pediatric): Secondary | ICD-10-CM | POA: Diagnosis not present

## 2023-04-29 NOTE — Progress Notes (Signed)
2//8/24- 61 yoM never smoker for sleep evaluation courtesy of Dr Beverely Low with concern of OSA with nocturnal hypoxemia. Medical problem list includes HTN, gout, hyperlipidemia, morbid obesity, Hx melanoma, polyarthralgia, HST 11/24/22- AHI 78.3/ hr, desaturation to 49%/ average 84%, body weight 275 lbs. Epworth score-16 Body weight today-279 lbs Covid vax-none Flu vax-  none                                   -----Had sleep study in December results were never discussed. Never had a cpap machine    He and his wife have slept separately for 30 years because of his loud snoring.  In the last year or 2 he is noticed much more daytime tiredness, has become less active, and is put on 40 pounds. No ENT surgery.  Always stuffy left nostril.  No sleep medications and little caffeine.  Up between 1 and 3 times at night for bathroom. No family history of sleep apnea.  Cardiology follows for hypertension and he denies cardiac or lung disease.  No routine cough or wheeze. We discussed results of his sleep study and implications of hypoxia, possibly reflecting obesity hypoventilation at night.  We discussed medical issues of sleep apnea, treatment choices, safe driving responsibility.  He chooses to start with CPAP.  04/30/23- 61 yoM never smoker followed for OSA, Asthma/ COPD overlap, complicated by HTN, gout, hyperlipidemia, morbid obesity, Hx melanoma, polyarthralgia CPAP auto 5-20/ [facility]     started 01/21/23 Download compliance-90%, AHI 2.5/ hr Body weight today-270 lbs He is pleased with his CPAP reporting that he sleeps much better since he has been using it.  He bought a couple of additional machines on his own to put in other sites rather than have to carry them around.  I talked with him today about availability of travel CPAP.  Download reviewed. I discussed his abnormal PFT today with clear chest exam and clear chest x-ray from February.  He had significant asthma as a child but avoided hospitalization,  remembering a lot of Primatene.  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  He does not recognize that he has significant shortness of breath.  We will let him try samples of Trelegy to see what effect this may have.  For now I will categorize this as an asthma/COPD overlap. PFT 04/30/23- severe obst, no resp to BD, air trapping w/o overinflation, nl DLCO CXR 01/23/23- IMPRESSION: No active cardiopulmonary disease.   ROS-see HPI   + = positive Constitutional:    weight loss, night sweats, fevers, chills, +fatigue, lassitude. HEENT:    headaches, difficulty swallowing,+ tooth/dental problems, sore throat,       sneezing, itching, ear ache, nasal congestion, post nasal drip, snoring CV:    chest pain, orthopnea, PND, swelling in lower extremities, anasarca,                                   dizziness, palpitations Resp:   shortness of breath with exertion or at rest.                productive cough,   non-productive cough, coughing up of blood.              change in color of mucus.  wheezing.   Skin:    rash or lesions.  GI:  No-   heartburn, indigestion, abdominal pain, nausea, vomiting, diarrhea,                 change in bowel habits, loss of appetite GU: dysuria, change in color of urine, no urgency or frequency.   flank pain. MS:  + joint pain, stiffness, decreased range of motion, back pain. Neuro-     nothing unusual Psych:  change in mood or affect.  depression or anxiety.   memory loss.  OBJ- Physical Exam General- Alert, Oriented, Affect-appropriate, Distress- none acute, + obese Skin- rash-none, lesions- none, excoriation- none Lymphadenopathy- none Head- atraumatic            Eyes- Gross vision intact, PERRLA, conjunctivae and secretions clear            Ears- Hearing, canals-normal            Nose- Clear, mucus, polyps, erosion, perforation + feels stuffy L nostril            Throat- Mallampati IV , mucosa clear , drainage- none,  tonsils- atrophic, +teeth Neck- flexible , trachea midline, no stridor , thyroid nl, carotid no bruit, +thick neck, Chest - symmetrical excursion , unlabored           Heart/CV- RRR , no murmur , no gallop  , no rub, nl s1 s2                           - JVD- none , edema- none, stasis changes- none, varices- none           Lung- clear to P&A, wheeze- none, cough- none , dullness-none, rub- none           Chest wall-  Abd-  Br/ Gen/ Rectal- Not done, not indicated Extrem- cyanosis- none, clubbing, none, atrophy- none, strength- nl Neuro- grossly intact to observation

## 2023-04-30 ENCOUNTER — Encounter: Payer: Self-pay | Admitting: Internal Medicine

## 2023-04-30 ENCOUNTER — Ambulatory Visit (INDEPENDENT_AMBULATORY_CARE_PROVIDER_SITE_OTHER): Payer: BC Managed Care – PPO | Admitting: Internal Medicine

## 2023-04-30 ENCOUNTER — Ambulatory Visit: Payer: BC Managed Care – PPO | Admitting: Internal Medicine

## 2023-04-30 VITALS — BP 134/78 | HR 105 | Ht 69.0 in | Wt 270.0 lb

## 2023-04-30 DIAGNOSIS — J4489 Other specified chronic obstructive pulmonary disease: Secondary | ICD-10-CM | POA: Diagnosis not present

## 2023-04-30 DIAGNOSIS — G4733 Obstructive sleep apnea (adult) (pediatric): Secondary | ICD-10-CM

## 2023-04-30 DIAGNOSIS — G4734 Idiopathic sleep related nonobstructive alveolar hypoventilation: Secondary | ICD-10-CM | POA: Diagnosis not present

## 2023-04-30 LAB — PULMONARY FUNCTION TEST
DL/VA % pred: 146 %
DL/VA: 6.18 ml/min/mmHg/L
DLCO cor % pred: 101 %
DLCO cor: 26.92 ml/min/mmHg
DLCO unc % pred: 101 %
DLCO unc: 26.92 ml/min/mmHg
FEF 25-75 Post: 0.57 L/sec
FEF 25-75 Pre: 1.26 L/sec
FEF2575-%Change-Post: -54 %
FEF2575-%Pred-Post: 20 %
FEF2575-%Pred-Pre: 45 %
FEV1-%Change-Post: -32 %
FEV1-%Pred-Post: 35 %
FEV1-%Pred-Pre: 52 %
FEV1-Post: 1.21 L
FEV1-Pre: 1.79 L
FEV1FVC-%Change-Post: -23 %
FEV1FVC-%Pred-Pre: 84 %
FEV6-%Change-Post: -11 %
FEV6-%Pred-Post: 57 %
FEV6-%Pred-Pre: 65 %
FEV6-Post: 2.48 L
FEV6-Pre: 2.81 L
FEV6FVC-%Pred-Post: 105 %
FEV6FVC-%Pred-Pre: 105 %
FVC-%Change-Post: -11 %
FVC-%Pred-Post: 54 %
FVC-%Pred-Pre: 61 %
FVC-Post: 2.48 L
FVC-Pre: 2.81 L
Post FEV1/FVC ratio: 49 %
Post FEV6/FVC ratio: 100 %
Pre FEV1/FVC ratio: 64 %
Pre FEV6/FVC Ratio: 100 %
RV % pred: 179 %
RV: 4.01 L
TLC % pred: 94 %
TLC: 6.42 L

## 2023-04-30 NOTE — Progress Notes (Signed)
Full PFT performed today. °

## 2023-04-30 NOTE — Patient Instructions (Signed)
Full PFT performed today. °

## 2023-04-30 NOTE — Assessment & Plan Note (Signed)
Never smoker.  His abnormal PFT may reflect significant childhood asthma followed by a long career of exposure to exhaust fumes, dust etc.  He does not recognize symptoms. Plan-try samples of Trelegy 100 for effect.

## 2023-04-30 NOTE — Assessment & Plan Note (Signed)
Good start, benefiting from CPAP with good compliance and control.  He has more than 1 machine, which may cause occasional gaps in record. Plan-continue auto 5-20

## 2023-04-30 NOTE — Patient Instructions (Signed)
You are doing great with CPAP. We can continue auto 5-20.  Order- sample x 2 Trelegy 100 inhaler     inhale 1 puff then rinse mouth, once daily

## 2023-05-04 ENCOUNTER — Other Ambulatory Visit: Payer: Self-pay | Admitting: Family Medicine

## 2023-05-10 DIAGNOSIS — G4733 Obstructive sleep apnea (adult) (pediatric): Secondary | ICD-10-CM | POA: Diagnosis not present

## 2023-05-29 DIAGNOSIS — L821 Other seborrheic keratosis: Secondary | ICD-10-CM | POA: Diagnosis not present

## 2023-05-29 DIAGNOSIS — L570 Actinic keratosis: Secondary | ICD-10-CM | POA: Diagnosis not present

## 2023-05-29 DIAGNOSIS — R233 Spontaneous ecchymoses: Secondary | ICD-10-CM | POA: Diagnosis not present

## 2023-06-10 DIAGNOSIS — G4733 Obstructive sleep apnea (adult) (pediatric): Secondary | ICD-10-CM | POA: Diagnosis not present

## 2023-07-10 DIAGNOSIS — G4733 Obstructive sleep apnea (adult) (pediatric): Secondary | ICD-10-CM | POA: Diagnosis not present

## 2023-07-26 ENCOUNTER — Other Ambulatory Visit: Payer: Self-pay | Admitting: Family Medicine

## 2023-08-10 DIAGNOSIS — G4733 Obstructive sleep apnea (adult) (pediatric): Secondary | ICD-10-CM | POA: Diagnosis not present

## 2023-08-29 NOTE — Progress Notes (Deleted)
HPI M never smoker followed for OSA, Asthma/ COPD overlap, complicated by HTN, gout, hyperlipidemia, morbid obesity, Hx melanoma, polyarthralgia HST 11/24/22- AHI 78.3/ hr, desaturation to 49%/ average 84%, body weight 275 lbs. PFT 04/30/23- severe obst, no resp to BD, air trapping w/o overinflation, nl DLCO ==========================================================   04/30/23- 61 yoM never smoker followed for OSA, Asthma/ COPD overlap, complicated by HTN, gout, hyperlipidemia, morbid obesity, Hx melanoma, polyarthralgia CPAP auto 5-20/ [facility]     started 01/21/23 Download compliance-90%, AHI 2.5/ hr Body weight today-270 lbs He is pleased with his CPAP reporting that he sleeps much better since he has been using it.  He bought a couple of additional machines on his own to put in other sites rather than have to carry them around.  I talked with him today about availability of travel CPAP.  Download reviewed. I discussed his abnormal PFT today with clear chest exam and clear chest x-ray from February.  He had significant asthma as a child but avoided hospitalization, remembering a lot of Primatene.  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  He does not recognize that he has significant shortness of breath.  We will let him try samples of Trelegy to see what effect this may have.  For now I will categorize this as an asthma/COPD overlap. PFT 04/30/23- severe obst, no resp to BD, air trapping w/o overinflation, nl DLCO CXR 01/23/23- IMPRESSION: No active cardiopulmonary disease.  08/31/23- 61 yoM never smoker followed for OSA, Asthma/ COPD overlap, complicated by HTN, gout, hyperlipidemia, morbid obesity, Hx melanoma, polyarthralgia - sampled Trelegy CPAP auto 5-20/ [facility]     started 01/21/23 Download compliance- Body weight today-   ROS-see HPI   + = positive Constitutional:    weight loss, night sweats, fevers, chills, +fatigue, lassitude. HEENT:    headaches,  difficulty swallowing,+ tooth/dental problems, sore throat,       sneezing, itching, ear ache, nasal congestion, post nasal drip, snoring CV:    chest pain, orthopnea, PND, swelling in lower extremities, anasarca,                                   dizziness, palpitations Resp:   shortness of breath with exertion or at rest.                productive cough,   non-productive cough, coughing up of blood.              change in color of mucus.  wheezing.   Skin:    rash or lesions. GI:  No-   heartburn, indigestion, abdominal pain, nausea, vomiting, diarrhea,                 change in bowel habits, loss of appetite GU: dysuria, change in color of urine, no urgency or frequency.   flank pain. MS:  + joint pain, stiffness, decreased range of motion, back pain. Neuro-     nothing unusual Psych:  change in mood or affect.  depression or anxiety.   memory loss.  OBJ- Physical Exam General- Alert, Oriented, Affect-appropriate, Distress- none acute, + obese Skin- rash-none, lesions- none, excoriation- none Lymphadenopathy- none Head- atraumatic            Eyes- Gross vision intact, PERRLA, conjunctivae and secretions clear            Ears- Hearing, canals-normal  Nose- Clear, mucus, polyps, erosion, perforation + feels stuffy L nostril            Throat- Mallampati IV , mucosa clear , drainage- none, tonsils- atrophic, +teeth Neck- flexible , trachea midline, no stridor , thyroid nl, carotid no bruit, +thick neck, Chest - symmetrical excursion , unlabored           Heart/CV- RRR , no murmur , no gallop  , no rub, nl s1 s2                           - JVD- none , edema- none, stasis changes- none, varices- none           Lung- clear to P&A, wheeze- none, cough- none , dullness-none, rub- none           Chest wall-  Abd-  Br/ Gen/ Rectal- Not done, not indicated Extrem- cyanosis- none, clubbing, none, atrophy- none, strength- nl Neuro- grossly intact to observation

## 2023-08-30 ENCOUNTER — Encounter: Payer: Self-pay | Admitting: Internal Medicine

## 2023-08-30 NOTE — Telephone Encounter (Signed)
Appointment canceled nfn

## 2023-08-31 ENCOUNTER — Ambulatory Visit: Payer: BC Managed Care – PPO | Admitting: Internal Medicine

## 2023-09-10 DIAGNOSIS — G4733 Obstructive sleep apnea (adult) (pediatric): Secondary | ICD-10-CM | POA: Diagnosis not present

## 2023-09-13 DIAGNOSIS — R300 Dysuria: Secondary | ICD-10-CM | POA: Diagnosis not present

## 2023-10-10 DIAGNOSIS — G4733 Obstructive sleep apnea (adult) (pediatric): Secondary | ICD-10-CM | POA: Diagnosis not present

## 2023-10-15 ENCOUNTER — Ambulatory Visit: Payer: BC Managed Care – PPO | Admitting: Cardiology

## 2023-10-26 ENCOUNTER — Other Ambulatory Visit: Payer: Self-pay | Admitting: Family Medicine

## 2023-11-10 DIAGNOSIS — G4733 Obstructive sleep apnea (adult) (pediatric): Secondary | ICD-10-CM | POA: Diagnosis not present

## 2023-11-17 ENCOUNTER — Other Ambulatory Visit: Payer: Self-pay | Admitting: Cardiology

## 2023-11-17 ENCOUNTER — Other Ambulatory Visit: Payer: Self-pay

## 2023-11-17 ENCOUNTER — Telehealth: Payer: Self-pay | Admitting: Family Medicine

## 2023-11-17 MED ORDER — FUROSEMIDE 20 MG PO TABS: 20.0000 mg | ORAL_TABLET | ORAL | 3 refills | Status: AC

## 2023-11-17 MED ORDER — ALLOPURINOL 300 MG PO TABS: 300.0000 mg | ORAL_TABLET | Freq: Every day | ORAL | 1 refills | Status: AC

## 2023-11-17 MED ORDER — AMLODIPINE BESYLATE 10 MG PO TABS: 10.0000 mg | ORAL_TABLET | Freq: Every day | ORAL | 3 refills | Status: DC

## 2023-11-17 NOTE — Telephone Encounter (Signed)
Caller name: Donavon Gerhold  On DPR?: Yes  Call back number: [phone] (mobile)  Provider they see: Sheliah Hatch, MD  Reason for call:  Returning Tonya's call. Told him he would get a call back when someone is available.

## 2023-11-17 NOTE — Telephone Encounter (Signed)
Ok to fill Amlodipine and Lasix as requested.  Unless he is having a heart issue/concern, he does not need to see Dr Dulce Sellar

## 2023-11-17 NOTE — Telephone Encounter (Signed)
See phone note

## 2023-11-17 NOTE — Telephone Encounter (Signed)
Spoke with Keith Haynes . He is asking if he needs to continue to see Dr.Munley ? He does not feel like he does. Keith Haynes is requesting Dr.Tabori prescribe   AmLODipine (NORVASC) 10 MG  Furosemide (LASIX) 20 MG  Please advise

## 2023-11-17 NOTE — Telephone Encounter (Signed)
Pt is aware and refills have been sent.

## 2023-11-17 NOTE — Telephone Encounter (Signed)
Called pt to verify what medications or medication her id referring to. Please find out at the time of the of the return call

## 2023-11-17 NOTE — Telephone Encounter (Signed)
Caller name: Chrisean Lanteigne  On DPR?: Yes  Call back number: [phone] (mobile)  Provider they see: Sheliah Hatch, MD  Reason for call:   Pt called asking that all his meds be filled. He has a physical scheduled for 12/17/2023

## 2023-12-17 ENCOUNTER — Encounter: Payer: Self-pay | Admitting: Family Medicine

## 2023-12-17 ENCOUNTER — Ambulatory Visit (INDEPENDENT_AMBULATORY_CARE_PROVIDER_SITE_OTHER): Payer: BC Managed Care – PPO | Admitting: Family Medicine

## 2023-12-17 VITALS — BP 148/110 | HR 89 | Temp 98.9°F | Ht 69.5 in | Wt 253.1 lb

## 2023-12-17 DIAGNOSIS — M1A09X0 Idiopathic chronic gout, multiple sites, without tophus (tophi): Secondary | ICD-10-CM

## 2023-12-17 DIAGNOSIS — I10 Essential (primary) hypertension: Secondary | ICD-10-CM

## 2023-12-17 DIAGNOSIS — Z125 Encounter for screening for malignant neoplasm of prostate: Secondary | ICD-10-CM | POA: Diagnosis not present

## 2023-12-17 DIAGNOSIS — Z0000 Encounter for general adult medical examination without abnormal findings: Secondary | ICD-10-CM

## 2023-12-17 NOTE — Progress Notes (Signed)
   Subjective:    Patient ID: Keith Haynes, male    DOB: 22-Dec-1960, 63 y.o.   MRN: [id number]  HPI CPE- UTD on colonoscopy. Declines flu, Tdap, shingles  Patient Care Team    Relationship Specialty Notifications Start End  Mahlon Comer BRAVO, MD PCP - General Family Medicine  03/26/14   Livingston Rigg, MD (Inactive) Consulting Physician Dermatology  11/04/15   Monetta Redell PARAS, MD Consulting Physician Cardiology  01/08/22   Pa, [facility]    01/08/22      Health Maintenance  Topic Date Due   Zoster Vaccines- Shingrix (1 of 2) Never done   DTaP/Tdap/Td (2 - Td or Tdap) 01/14/2023   INFLUENZA VACCINE  Never done   Colonoscopy  05/29/2029   Hepatitis C Screening  Completed   HIV Screening  Completed   HPV VACCINES  Aged Out   COVID-19 Vaccine  Discontinued      Review of Systems Patient reports no vision/hearing changes, anorexia, fever ,adenopathy, persistant/recurrent hoarseness, swallowing issues, chest pain, palpitations, edema, persistant/recurrent cough, hemoptysis, dyspnea (rest,exertional, paroxysmal nocturnal), gastrointestinal  bleeding (melena, rectal bleeding), abdominal pain, excessive heart burn, GU symptoms (dysuria, hematuria, voiding/incontinence issues) syncope, focal weakness, memory loss, numbness & tingling, skin/hair/nail changes, depression, anxiety, abnormal bruising/bleeding, musculoskeletal symptoms/signs.   + HTN- pt is using CPAP nightly.  Is having increased joint pains and taking ibuprofen more than usual.      Objective:   Physical Exam General Appearance:    Alert, cooperative, no distress, appears stated age, obese  Head:    Normocephalic, without obvious abnormality, atraumatic  Eyes:    PERRL, conjunctiva/corneas clear, EOM's intact both eyes       Ears:    Normal TM's and external ear canals, both ears  Nose:   Nares normal, septum midline, mucosa normal, no drainage   or sinus tenderness  Throat:   Lips, mucosa, and tongue  normal; teeth and gums normal  Neck:   Supple, symmetrical, trachea midline, no adenopathy;       thyroid :  No enlargement/tenderness/nodules  Back:     Symmetric, no curvature, ROM normal, no CVA tenderness  Lungs:     Clear to auscultation bilaterally, respirations unlabored  Chest wall:    No tenderness or deformity  Heart:    Regular rate and rhythm, S1 and S2 normal, no murmur, rub   or gallop  Abdomen:     Soft, non-tender, bowel sounds active all four quadrants,    no masses, no organomegaly  Genitalia:    deferred  Rectal:    Extremities:   Extremities normal, atraumatic, no cyanosis or edema  Pulses:   2+ and symmetric all extremities  Skin:   Skin color, texture, turgor normal, no rashes or lesions  Lymph nodes:   Cervical, supraclavicular, and axillary nodes normal  Neurologic:   CNII-XII intact. Normal strength, sensation and reflexes      throughout          Assessment & Plan:

## 2023-12-17 NOTE — Patient Instructions (Addendum)
 Follow up in 1 month to recheck blood pressure We'll notify you of your lab results and make any changes if needed INCREASE the Allopurinol  to twice daily Keep up the good work on healthy diet and regular exercise- you're doing great!! Drink plenty of fluids to help w/ gout Try and limit your salt to improve your blood pressure Call with any questions or concerns Hang in there! Happy New Year!

## 2023-12-18 LAB — CBC WITH DIFFERENTIAL/PLATELET
Absolute Lymphocytes: 2024 {cells}/uL (ref 850–3900)
Absolute Monocytes: 984 {cells}/uL — ABNORMAL HIGH (ref 200–950)
Basophils Absolute: 74 {cells}/uL (ref 0–200)
Basophils Relative: 0.8 %
Eosinophils Absolute: 405 {cells}/uL (ref 15–500)
Eosinophils Relative: 4.4 %
HCT: 49.4 % (ref 38.5–50.0)
Hemoglobin: 17.1 g/dL (ref 13.2–17.1)
MCH: 33.4 pg — ABNORMAL HIGH (ref 27.0–33.0)
MCHC: 34.6 g/dL (ref 32.0–36.0)
MCV: 96.5 fL (ref 80.0–100.0)
MPV: 11.7 fL (ref 7.5–12.5)
Monocytes Relative: 10.7 %
Neutro Abs: 5713 {cells}/uL (ref 1500–7800)
Neutrophils Relative %: 62.1 %
Platelets: 146 10*3/uL (ref 140–400)
RBC: 5.12 10*6/uL (ref 4.20–5.80)
RDW: 12.6 % (ref 11.0–15.0)
Total Lymphocyte: 22 %
WBC: 9.2 10*3/uL (ref 3.8–10.8)

## 2023-12-18 LAB — BASIC METABOLIC PANEL
BUN: 19 mg/dL (ref 7–25)
CO2: 25 mmol/L (ref 20–32)
Calcium: 9.5 mg/dL (ref 8.6–10.3)
Chloride: 109 mmol/L (ref 98–110)
Creat: 1.3 mg/dL (ref 0.70–1.35)
Glucose, Bld: 91 mg/dL (ref 65–99)
Potassium: 4.1 mmol/L (ref 3.5–5.3)
Sodium: 143 mmol/L (ref 135–146)

## 2023-12-18 LAB — HEPATIC FUNCTION PANEL
AG Ratio: 1.7 (calc) (ref 1.0–2.5)
ALT: 21 U/L (ref 9–46)
AST: 24 U/L (ref 10–35)
Albumin: 4.2 g/dL (ref 3.6–5.1)
Alkaline phosphatase (APISO): 162 U/L — ABNORMAL HIGH (ref 35–144)
Bilirubin, Direct: 0.1 mg/dL (ref 0.0–0.2)
Globulin: 2.5 g/dL (ref 1.9–3.7)
Indirect Bilirubin: 0.4 mg/dL (ref 0.2–1.2)
Total Bilirubin: 0.5 mg/dL (ref 0.2–1.2)
Total Protein: 6.7 g/dL (ref 6.1–8.1)

## 2023-12-18 LAB — LIPID PANEL
Cholesterol: 153 mg/dL (ref <200)
HDL: 41 mg/dL (ref >=40)
LDL Cholesterol (Calc): 88 mg/dL
Non-HDL Cholesterol (Calc): 112 mg/dL (ref <130)
Total CHOL/HDL Ratio: 3.7 (calc) (ref <5.0)
Triglycerides: 137 mg/dL (ref <150)

## 2023-12-18 LAB — TSH: TSH: 2.28 m[IU]/L (ref 0.40–4.50)

## 2023-12-18 LAB — HEMOGLOBIN A1C
Hgb A1c MFr Bld: 6.1 %{Hb} — ABNORMAL HIGH (ref <5.7)
Mean Plasma Glucose: 128 mg/dL
eAG (mmol/L): 7.1 mmol/L

## 2023-12-18 LAB — URIC ACID: Uric Acid, Serum: 7.1 mg/dL (ref 4.0–8.0)

## 2023-12-18 LAB — PSA: PSA: 2.65 ng/mL (ref <=4.00)

## 2023-12-20 ENCOUNTER — Telehealth: Payer: Self-pay

## 2023-12-20 NOTE — Telephone Encounter (Signed)
-----   Message from Comer Greet sent at 12/20/2023  9:47 AM EST ----- Labs look good w/ exception of elevated Alk Phos.  We will add a GGT to determine if this is due to a liver issue or some other cause (GGT, dx- elevated alk phos).  Your A1C is now in the pre-diabetes range.  Please work on a low carb/low sugar diet as well as regular physical activity to prevent this from climbing into the diabetes range.  Uric acid level and PSA levels are in normal range- great news!

## 2023-12-21 ENCOUNTER — Encounter: Payer: Self-pay | Admitting: Family Medicine

## 2023-12-21 NOTE — Telephone Encounter (Signed)
 Agreeable to wait until his upcoming appt

## 2023-12-21 NOTE — Assessment & Plan Note (Signed)
 Not controlled.  Pt reports increased use of NSAIDs.  Will have him increase water, limit salt, and stop NSAIDs to try and improve BP.  If still elevated at next visit, will need to add/adjust meds.  Pt expressed understanding and is in agreement w/ plan.

## 2023-12-21 NOTE — Assessment & Plan Note (Signed)
 Deteriorated.  Pt reports increased flares and pain.  Will increase Allopurinol to twice daily and see if frequency of flares improves.

## 2023-12-21 NOTE — Assessment & Plan Note (Signed)
 Pt's PE WNL w/ exception of BMI.  UTD on colonoscopy.  Declines vaccines today.  Check labs.  Anticipatory guidance provided.

## 2023-12-21 NOTE — Telephone Encounter (Signed)
 Noted.

## 2023-12-21 NOTE — Assessment & Plan Note (Signed)
Ongoing issue for pt.  Encouraged low carb diet and regular exercise.  Check labs to risk stratify.  Will follow.

## 2024-01-14 ENCOUNTER — Encounter: Payer: Self-pay | Admitting: Family Medicine

## 2024-01-14 ENCOUNTER — Ambulatory Visit: Payer: BC Managed Care – PPO | Admitting: Family Medicine

## 2024-01-14 VITALS — BP 144/90 | HR 74 | Temp 98.2°F | Ht 69.5 in | Wt 264.0 lb

## 2024-01-14 DIAGNOSIS — M79671 Pain in right foot: Secondary | ICD-10-CM

## 2024-01-14 DIAGNOSIS — M79604 Pain in right leg: Secondary | ICD-10-CM | POA: Diagnosis not present

## 2024-01-14 DIAGNOSIS — R748 Abnormal levels of other serum enzymes: Secondary | ICD-10-CM | POA: Diagnosis not present

## 2024-01-14 DIAGNOSIS — I10 Essential (primary) hypertension: Secondary | ICD-10-CM

## 2024-01-14 DIAGNOSIS — M79605 Pain in left leg: Secondary | ICD-10-CM

## 2024-01-14 DIAGNOSIS — M79672 Pain in left foot: Secondary | ICD-10-CM

## 2024-01-14 LAB — HEPATIC FUNCTION PANEL
ALT: 18 U/L (ref 0–53)
AST: 21 U/L (ref 0–37)
Albumin: 4.1 g/dL (ref 3.5–5.2)
Alkaline Phosphatase: 167 U/L — ABNORMAL HIGH (ref 39–117)
Bilirubin, Direct: 0.1 mg/dL (ref 0.0–0.3)
Total Bilirubin: 0.5 mg/dL (ref 0.2–1.2)
Total Protein: 6.6 g/dL (ref 6.0–8.3)

## 2024-01-14 LAB — GAMMA GT: GGT: 26 U/L (ref 7–51)

## 2024-01-14 NOTE — Patient Instructions (Addendum)
Follow up in 3 months to recheck blood pressure We'll notify you of your lab results and make any changes if needed We'll call you to schedule your Ortho appt for the pain Call with any questions or concerns Stay Safe!  Stay Healthy! Hang in there!!

## 2024-01-14 NOTE — Assessment & Plan Note (Signed)
Chronic problem.  BP is better today than last visit but still not at goal.  He feels this is directly related to his ongoing and significant pain in bilateral feet and calves.  He is on Amlodipine 10mg  daily and Telmisartan 80mg  daily.  Was considering adding Hydralazine but pt would like to limit the amount of medication he is taking b/c he isn't sure that his pain isn't medication related.  Will refer to Ortho to try and improve pain levels, which will likely lower BP.  Will continue to follow closely.

## 2024-01-14 NOTE — Progress Notes (Signed)
   Subjective:    Patient ID: Keith Haynes, male    DOB: 02/28/1961, 63 y.o.   MRN: [id number]  HPI HTN- ongoing issue.  Currently on Amlodipine 10mg  daily, Telmisartan 80mg  daily.  BP is better today than last visit- particularly diastolic number- but still not at goal.  Pt has gained 11 lbs in the last month.    Pain- pt reports pain in his feet and calves 'constantly'.  Will have intermittent gout flares but is able to distinguish the pain.  Is on Allopurinol 600mg  daily.  Will take Colchicine as needed.  Pain has been ongoing but is worsening w/ time and impacting his gait and interest in activity.    Elevated Alk phos- new at last visit.  Due for repeat labs   Review of Systems For ROS see HPI     Objective:   Physical Exam Vitals reviewed.  Constitutional:      General: He is not in acute distress.    Appearance: Normal appearance. He is well-developed. He is obese. He is not ill-appearing.  HENT:     Head: Normocephalic and atraumatic.  Eyes:     Extraocular Movements: Extraocular movements intact.     Conjunctiva/sclera: Conjunctivae normal.     Pupils: Pupils are equal, round, and reactive to light.  Neck:     Thyroid: No thyromegaly.  Cardiovascular:     Rate and Rhythm: Normal rate and regular rhythm.     Pulses: Normal pulses.     Heart sounds: Normal heart sounds. No murmur heard. Pulmonary:     Effort: Pulmonary effort is normal. No respiratory distress.     Breath sounds: Normal breath sounds.  Abdominal:     General: Bowel sounds are normal. There is no distension.     Palpations: Abdomen is soft.  Musculoskeletal:     Cervical back: Normal range of motion and neck supple.     Right lower leg: No edema.     Left lower leg: No edema.  Lymphadenopathy:     Cervical: No cervical adenopathy.  Skin:    General: Skin is warm and dry.  Neurological:     General: No focal deficit present.     Mental Status: He is alert and oriented to person, place, and time.      Cranial Nerves: No cranial nerve deficit.  Psychiatric:        Mood and Affect: Mood normal.        Behavior: Behavior normal.           Assessment & Plan:  Pain in feet and lower legs- deteriorated.  Bilateral.  Pt reports sxs are worsening and interfering w/ his quality of life.  States this is separate from his gout pain.  Will refer to Ortho for complete evaluation and treatment.  Pt expressed understanding and is in agreement w/ plan.   Elevated alk phos- new.  At last visit Alk Phos was elevated.  Due for repeat labs today w/ GGT.

## 2024-01-15 DIAGNOSIS — L570 Actinic keratosis: Secondary | ICD-10-CM | POA: Diagnosis not present

## 2024-01-15 DIAGNOSIS — C44329 Squamous cell carcinoma of skin of other parts of face: Secondary | ICD-10-CM | POA: Diagnosis not present

## 2024-01-17 ENCOUNTER — Encounter: Payer: Self-pay | Admitting: Family Medicine

## 2024-01-17 ENCOUNTER — Telehealth: Payer: Self-pay

## 2024-01-17 NOTE — Telephone Encounter (Signed)
-----   Message from Neena Rhymes sent at 01/17/2024  7:51 AM EST ----- Alk phos remains mildly elevated but your GGT is normal which means this is not a liver issue.  This is good news!

## 2024-01-17 NOTE — Telephone Encounter (Signed)
 Pt has reviewed labs via MyChart

## 2024-01-21 DIAGNOSIS — M7661 Achilles tendinitis, right leg: Secondary | ICD-10-CM | POA: Diagnosis not present

## 2024-01-21 DIAGNOSIS — M7662 Achilles tendinitis, left leg: Secondary | ICD-10-CM | POA: Diagnosis not present

## 2024-04-14 ENCOUNTER — Encounter: Payer: Self-pay | Admitting: Family Medicine

## 2024-04-14 ENCOUNTER — Ambulatory Visit: Payer: BC Managed Care – PPO | Admitting: Family Medicine

## 2024-04-14 VITALS — BP 156/94 | HR 69 | Temp 98.0°F | Ht 69.5 in | Wt 259.6 lb

## 2024-04-14 DIAGNOSIS — I10 Essential (primary) hypertension: Secondary | ICD-10-CM

## 2024-04-14 MED ORDER — CHLORTHALIDONE 25 MG PO TABS: 25.0000 mg | ORAL_TABLET | Freq: Every day | ORAL | 3 refills | Status: DC

## 2024-04-14 NOTE — Patient Instructions (Signed)
 Follow up in 4-6 weeks to recheck BP ADD the Chlorthalidone daily Drink LOTS of water LIMIT your salt intake Continue all your other medications Call with any questions or concerns Stay Safe!  Stay Healthy!

## 2024-04-14 NOTE — Assessment & Plan Note (Signed)
 Deteriorated.  BP is elevated at 156/94.  Will add Chlorthalidone to the Telmisartan  and Amlodipine  that he is already taking.  Encouraged him to limit the amount of salt in his diet, increase exercise.  Will follow.

## 2024-04-14 NOTE — Progress Notes (Signed)
   Subjective:    Patient ID: Keith Haynes, male    DOB: [birth year], 63 y.o.   MRN: [id number]  HPI HTN- ongoing issue.  BP is elevated today at 160/100.  Currently on Telmisartan  80mg  daily, Amlodipine  10mg  daily, only taking Lasix  ~1x/week.  No CP, SOB, HA's, visual changes, edema.   Review of Systems For ROS see HPI     Objective:   Physical Exam Vitals reviewed.  Constitutional:      General: He is not in acute distress.    Appearance: Normal appearance. He is well-developed. He is obese. He is not ill-appearing.  HENT:     Head: Normocephalic and atraumatic.  Eyes:     Extraocular Movements: Extraocular movements intact.     Conjunctiva/sclera: Conjunctivae normal.     Pupils: Pupils are equal, round, and reactive to light.  Neck:     Thyroid : No thyromegaly.  Cardiovascular:     Rate and Rhythm: Normal rate and regular rhythm.     Pulses: Normal pulses.     Heart sounds: Normal heart sounds. No murmur heard. Pulmonary:     Effort: Pulmonary effort is normal. No respiratory distress.     Breath sounds: Normal breath sounds.  Abdominal:     General: Bowel sounds are normal. There is no distension.     Palpations: Abdomen is soft.  Musculoskeletal:     Cervical back: Normal range of motion and neck supple.     Right lower leg: No edema.     Left lower leg: No edema.  Lymphadenopathy:     Cervical: No cervical adenopathy.  Skin:    General: Skin is warm and dry.  Neurological:     General: No focal deficit present.     Mental Status: He is alert and oriented to person, place, and time.     Cranial Nerves: No cranial nerve deficit.  Psychiatric:        Mood and Affect: Mood normal.        Behavior: Behavior normal.           Assessment & Plan:

## 2024-05-26 ENCOUNTER — Ambulatory Visit: Admitting: Family Medicine

## 2024-06-08 ENCOUNTER — Ambulatory Visit: Admitting: Family Medicine

## 2024-06-08 ENCOUNTER — Encounter: Payer: Self-pay | Admitting: Family Medicine

## 2024-06-08 VITALS — BP 128/80 | HR 110 | Temp 98.0°F | Ht 69.5 in | Wt 261.6 lb

## 2024-06-08 DIAGNOSIS — I10 Essential (primary) hypertension: Secondary | ICD-10-CM

## 2024-06-08 NOTE — Progress Notes (Signed)
   Subjective:    Patient ID: Keith Haynes, male    DOB: [birth year], 63 y.o.   MRN: [id number]  HPI HTN- chronic problem.  BP was elevated at last visit so we added Hydralazine .  Now on Amlodipine  10mg  daily, Chlorthalidone  25mg  daily, Lasix  3x/week prn, Telmisartan  80mg  daily w/ good control.  Had to stop medication due to gout flare.  No longer taking Chlorthalidone .  No CP, SOB, HA's, visual changes, edema.   Review of Systems For ROS see HPI     Objective:   Physical Exam Constitutional:      General: He is not in acute distress.    Appearance: He is well-developed.  HENT:     Head: Normocephalic and atraumatic.   Eyes:     Extraocular Movements: Extraocular movements intact.     Conjunctiva/sclera: Conjunctivae normal.     Pupils: Pupils are equal, round, and reactive to light.   Neck:     Thyroid : No thyromegaly.   Cardiovascular:     Rate and Rhythm: Regular rhythm. Tachycardia present.     Pulses: Normal pulses.     Heart sounds: Normal heart sounds. No murmur heard. Pulmonary:     Effort: Pulmonary effort is normal. No respiratory distress.     Breath sounds: Normal breath sounds.  Abdominal:     General: Bowel sounds are normal. There is no distension.     Palpations: Abdomen is soft.   Musculoskeletal:     Cervical back: Normal range of motion and neck supple.     Right lower leg: No edema.     Left lower leg: No edema.  Lymphadenopathy:     Cervical: No cervical adenopathy.   Skin:    General: Skin is warm and dry.   Neurological:     General: No focal deficit present.     Mental Status: He is alert and oriented to person, place, and time.     Cranial Nerves: No cranial nerve deficit.   Psychiatric:        Mood and Affect: Mood normal.        Behavior: Behavior normal.           Assessment & Plan:

## 2024-06-08 NOTE — Assessment & Plan Note (Signed)
 Chronic problem.  Well controlled today.  Currently asymptomatic.  Not able to tolerate chlorthalidone  due to gout flare.  Since BP is well controlled will proceed w/ current meds.  Pt expressed understanding and is in agreement w/ plan.

## 2024-06-08 NOTE — Patient Instructions (Signed)
 Schedule your complete physical in January No need for labs today- you look great! No med changes at this time- keep up the good work! Continue to drink LOTS of water!!! Call with any questions or concerns Stay Safe!  Stay Healthy! Have a great summer!!!

## 2024-06-09 ENCOUNTER — Other Ambulatory Visit: Payer: Self-pay | Admitting: Family Medicine

## 2024-07-29 DIAGNOSIS — L570 Actinic keratosis: Secondary | ICD-10-CM | POA: Diagnosis not present

## 2024-07-29 DIAGNOSIS — C44519 Basal cell carcinoma of skin of other part of trunk: Secondary | ICD-10-CM | POA: Diagnosis not present

## 2024-12-05 ENCOUNTER — Other Ambulatory Visit: Payer: Self-pay | Admitting: Family Medicine

## 2024-12-13 ENCOUNTER — Other Ambulatory Visit: Payer: Self-pay | Admitting: Family Medicine

## 2024-12-22 ENCOUNTER — Other Ambulatory Visit: Payer: Self-pay

## 2024-12-22 ENCOUNTER — Encounter: Payer: Self-pay | Admitting: Family Medicine

## 2024-12-22 ENCOUNTER — Ambulatory Visit: Admitting: Family Medicine

## 2024-12-22 VITALS — BP 136/88 | HR 79 | Ht 70.0 in | Wt 271.0 lb

## 2024-12-22 DIAGNOSIS — Z0000 Encounter for general adult medical examination without abnormal findings: Secondary | ICD-10-CM | POA: Diagnosis not present

## 2024-12-22 DIAGNOSIS — N529 Male erectile dysfunction, unspecified: Secondary | ICD-10-CM | POA: Diagnosis not present

## 2024-12-22 DIAGNOSIS — Z125 Encounter for screening for malignant neoplasm of prostate: Secondary | ICD-10-CM | POA: Diagnosis not present

## 2024-12-22 DIAGNOSIS — I10 Essential (primary) hypertension: Secondary | ICD-10-CM | POA: Diagnosis not present

## 2024-12-22 LAB — CBC WITH DIFFERENTIAL/PLATELET
Basophils Absolute: 0 K/uL (ref 0.0–0.1)
Basophils Relative: 0.7 % (ref 0.0–3.0)
Eosinophils Absolute: 0.3 K/uL (ref 0.0–0.7)
Eosinophils Relative: 4.7 % (ref 0.0–5.0)
HCT: 48.5 % (ref 39.0–52.0)
Hemoglobin: 16.4 g/dL (ref 13.0–17.0)
Lymphocytes Relative: 23.3 % (ref 12.0–46.0)
Lymphs Abs: 1.6 K/uL (ref 0.7–4.0)
MCHC: 33.8 g/dL (ref 30.0–36.0)
MCV: 98.6 fl (ref 78.0–100.0)
Monocytes Absolute: 1 K/uL (ref 0.1–1.0)
Monocytes Relative: 14.6 % — ABNORMAL HIGH (ref 3.0–12.0)
Neutro Abs: 3.8 K/uL (ref 1.4–7.7)
Neutrophils Relative %: 56.7 % (ref 43.0–77.0)
Platelets: 155 K/uL (ref 150.0–400.0)
RBC: 4.92 Mil/uL (ref 4.22–5.81)
RDW: 13.4 % (ref 11.5–15.5)
WBC: 6.8 K/uL (ref 4.0–10.5)

## 2024-12-22 LAB — BASIC METABOLIC PANEL WITH GFR
BUN: 20 mg/dL (ref 6–23)
CO2: 30 meq/L (ref 19–32)
Calcium: 9.4 mg/dL (ref 8.4–10.5)
Chloride: 105 meq/L (ref 96–112)
Creatinine, Ser: 1.17 mg/dL (ref 0.40–1.50)
GFR: 66.02 mL/min
Glucose, Bld: 101 mg/dL — ABNORMAL HIGH (ref 70–99)
Potassium: 4.6 meq/L (ref 3.5–5.1)
Sodium: 141 meq/L (ref 135–145)

## 2024-12-22 LAB — HEPATIC FUNCTION PANEL
ALT: 24 U/L (ref 3–53)
AST: 26 U/L (ref 5–37)
Albumin: 4.2 g/dL (ref 3.5–5.2)
Alkaline Phosphatase: 198 U/L — ABNORMAL HIGH (ref 39–117)
Bilirubin, Direct: 0.1 mg/dL (ref 0.1–0.3)
Total Bilirubin: 0.4 mg/dL (ref 0.2–1.2)
Total Protein: 6.9 g/dL (ref 6.0–8.3)

## 2024-12-22 LAB — HEMOGLOBIN A1C: Hgb A1c MFr Bld: 5.9 % (ref 4.6–6.5)

## 2024-12-22 LAB — LIPID PANEL
Cholesterol: 157 mg/dL (ref 28–200)
HDL: 48.8 mg/dL
LDL Cholesterol: 90 mg/dL (ref 10–99)
NonHDL: 107.9
Total CHOL/HDL Ratio: 3
Triglycerides: 91 mg/dL (ref 10.0–149.0)
VLDL: 18.2 mg/dL (ref 0.0–40.0)

## 2024-12-22 LAB — TSH: TSH: 2.07 u[IU]/mL (ref 0.35–5.50)

## 2024-12-22 LAB — PSA: PSA: 5.08 ng/mL — ABNORMAL HIGH (ref 0.10–4.00)

## 2024-12-22 MED ORDER — SILDENAFIL CITRATE 100 MG PO TABS: ORAL_TABLET | ORAL | 27 refills | Status: AC

## 2024-12-22 NOTE — Patient Instructions (Signed)
Follow up in 6 months to recheck BP and cholesterol We'll notify you of your lab results and make any changes if needed Continue to work on healthy diet and regular exercise- you can do it! Call with any questions or concerns Stay Safe!  Stay Healthy! Happy New Year!!!

## 2024-12-22 NOTE — Progress Notes (Unsigned)
" ° °  Subjective:    Patient ID: Keith Haynes, male    DOB: 1961/05/06, 64 y.o.   MRN: [id number]  HPI CPE- UTD on colonoscopy.  Declines Tdap, PNA  Health Maintenance  Topic Date Due   COVID-19 Vaccine (1) Never done   Pneumococcal Vaccine: 50+ Years (1 of 2 - PCV) Never done   Zoster Vaccines- Shingrix (1 of 2) Never done   DTaP/Tdap/Td (2 - Td or Tdap) 01/14/2023   Influenza Vaccine  Never done   Colonoscopy  05/29/2029   Hepatitis C Screening  Completed   HIV Screening  Completed   Hepatitis B Vaccines 19-59 Average Risk  Aged Out   HPV VACCINES  Aged Out   Meningococcal B Vaccine  Aged Out    Patient Care Team    Relationship Specialty Notifications Start End  Mahlon Comer BRAVO, MD PCP - General Family Medicine  03/26/14   Livingston Rigg, MD Consulting Physician Dermatology  11/04/15   Monetta Redell PARAS, MD Consulting Physician Cardiology  01/08/22   [facility], [facility]    01/08/22       Review of Systems Patient reports no vision/hearing changes, anorexia, fever ,adenopathy, persistant/recurrent hoarseness, swallowing issues, chest pain, palpitations, edema, persistant/recurrent cough, hemoptysis, dyspnea (rest,exertional, paroxysmal nocturnal), gastrointestinal  bleeding (melena, rectal bleeding), abdominal pain, excessive heart burn, GU symptoms (dysuria, hematuria, voiding/incontinence issues) syncope, focal weakness, memory loss, numbness & tingling, skin/hair/nail changes, depression, anxiety, abnormal bruising/bleeding, musculoskeletal symptoms/signs.   + 9 lb weight gain    Objective:   Physical Exam General Appearance:    Alert, cooperative, no distress, appears stated age  Head:    Normocephalic, without obvious abnormality, atraumatic  Eyes:    PERRL, conjunctiva/corneas clear, EOM's intact both eyes       Ears:    Normal TM's and external ear canals, both ears  Nose:   Nares normal, septum midline, mucosa normal, no drainage   or sinus tenderness   Throat:   Lips, mucosa, and tongue normal; teeth and gums normal  Neck:   Supple, symmetrical, trachea midline, no adenopathy;       thyroid :  No enlargement/tenderness/nodules  Back:     Symmetric, no curvature, ROM normal, no CVA tenderness  Lungs:     Clear to auscultation bilaterally, respirations unlabored  Chest wall:    No tenderness or deformity  Heart:    Regular rate and rhythm, S1 and S2 normal, no murmur, rub   or gallop  Abdomen:     Soft, non-tender, bowel sounds active all four quadrants,    no masses, no organomegaly  Genitalia:    Deferred  Rectal:    Extremities:   Extremities normal, atraumatic, no cyanosis or edema  Pulses:   2+ and symmetric all extremities  Skin:   Skin color, texture, turgor normal, no rashes or lesions  Lymph nodes:   Cervical, supraclavicular, and axillary nodes normal  Neurologic:   CNII-XII intact. Normal strength, sensation and reflexes      throughout          Assessment & Plan:    "

## 2024-12-24 NOTE — Assessment & Plan Note (Signed)
 Deteriorated.  Pt has gained 10 lbs since last visit.  BMI 38.88 but coupled w/ HTN this qualifies as morbidly obese.  Encouraged low carb diet and regular exercise.  Will follow.

## 2024-12-24 NOTE — Assessment & Plan Note (Signed)
 Chronic problem.  Adequate control today.  Asymptomatic.  Stressed importance of low sodium diet, weight loss, and regular physical activity.  Will follow.

## 2024-12-24 NOTE — Assessment & Plan Note (Signed)
 Pt's PE WNL w/ exception of BMI.  UTD on colonoscopy.  Declines vaccines.  Check labs.  Anticipatory guidance provided.

## 2024-12-25 ENCOUNTER — Ambulatory Visit (INDEPENDENT_AMBULATORY_CARE_PROVIDER_SITE_OTHER)

## 2024-12-25 ENCOUNTER — Ambulatory Visit: Payer: Self-pay | Admitting: Family Medicine

## 2024-12-25 DIAGNOSIS — R748 Abnormal levels of other serum enzymes: Secondary | ICD-10-CM

## 2024-12-25 LAB — GAMMA GT: GGT: 26 U/L (ref 7–51)

## 2024-12-25 NOTE — Telephone Encounter (Signed)
 Pt is not still seeing urology at this time

## 2024-12-25 NOTE — Progress Notes (Signed)
 Ordered add on lab. Sent via fax to [facility].

## 2024-12-26 ENCOUNTER — Encounter: Payer: Self-pay | Admitting: Family Medicine

## 2024-12-26 DIAGNOSIS — R748 Abnormal levels of other serum enzymes: Secondary | ICD-10-CM

## 2024-12-26 DIAGNOSIS — R972 Elevated prostate specific antigen [PSA]: Secondary | ICD-10-CM

## 2024-12-26 NOTE — Telephone Encounter (Signed)
 Reviewed last OV note & recent labs. Urology referral pended below to requested provider and office for your approval.

## 2025-01-01 ENCOUNTER — Telehealth: Payer: Self-pay | Admitting: Family Medicine

## 2025-01-01 NOTE — Telephone Encounter (Signed)
 Spoke with Keith Haynes, she does not have copy of recent PSA. Routed via [employer]. She will give to provider to review. Nothing further needed at this time.

## 2025-01-01 NOTE — Telephone Encounter (Signed)
 Copied from CRM #[id number]. Topic: Referral - Status >> Jan 01, 2025  3:06 PM Macario HERO wrote: Reason for CRM: Mykayla from [facility] called said the doctor reviewed and did not see any laps to support elevated PSA.  Phone: [phone]

## 2025-01-09 NOTE — Telephone Encounter (Signed)
 Faxed results to the fax number included

## 2025-01-09 NOTE — Telephone Encounter (Unsigned)
 Copied from CRM #[id number]. Topic: Referral - Status >> Jan 01, 2025  3:06 PM Macario HERO wrote: Reason for CRM: Mykayla from [facility] called said the doctor reviewed and did not see any laps to support elevated PSA.  Phone: [phone] >> Jan 09, 2025 10:58 AM Macario HERO wrote: [facility] called regarding PSA labs. Stated she spoke with someone in the office and still did not receive the labs. - Fax number: [phone]

## 2025-06-29 ENCOUNTER — Ambulatory Visit: Admitting: Family Medicine
